# Patient Record
Sex: Female | Born: 1995 | Race: White | Hispanic: No | Marital: Single | Smoking: Never smoker
Health system: Southern US, Community
[De-identification: ages and names within clinical notes are randomized; demographics above are authoritative.]

## PROBLEM LIST (undated history)

## (undated) DIAGNOSIS — F410 Panic disorder [episodic paroxysmal anxiety] without agoraphobia: Secondary | ICD-10-CM

## (undated) DIAGNOSIS — K5909 Other constipation: Secondary | ICD-10-CM

## (undated) DIAGNOSIS — S83249A Other tear of medial meniscus, current injury, unspecified knee, initial encounter: Secondary | ICD-10-CM

## (undated) DIAGNOSIS — N920 Excessive and frequent menstruation with regular cycle: Secondary | ICD-10-CM

## (undated) DIAGNOSIS — F988 Other specified behavioral and emotional disorders with onset usually occurring in childhood and adolescence: Secondary | ICD-10-CM

## (undated) DIAGNOSIS — F411 Generalized anxiety disorder: Secondary | ICD-10-CM

## (undated) HISTORY — DX: Other constipation: K59.09

## (undated) HISTORY — DX: Other specified behavioral and emotional disorders with onset usually occurring in childhood and adolescence: F98.8

## (undated) HISTORY — DX: Generalized anxiety disorder: F41.1

## (undated) HISTORY — DX: Other tear of medial meniscus, current injury, unspecified knee, initial encounter: S83.249A

## (undated) HISTORY — DX: Panic disorder (episodic paroxysmal anxiety): F41.0

## (undated) HISTORY — DX: Excessive and frequent menstruation with regular cycle: N92.0

---
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
## 2018-02-14 ENCOUNTER — Emergency Department (HOSPITAL_COMMUNITY)
Admission: EM | Admit: 2018-02-14 | Discharge: 2018-02-14 | Disposition: A | Discharge status: Home / Self Care | Payer: BLUE CROSS/BLUE SHIELD | Attending: Emergency Medicine | Admitting: Emergency Medicine

## 2018-02-14 ENCOUNTER — Other Ambulatory Visit: Payer: Self-pay

## 2018-02-14 ENCOUNTER — Encounter (HOSPITAL_COMMUNITY): Payer: Self-pay | Admitting: Emergency Medicine

## 2018-02-14 DIAGNOSIS — J329 Chronic sinusitis, unspecified: Secondary | ICD-10-CM | POA: Diagnosis not present

## 2018-02-14 DIAGNOSIS — R05 Cough: Secondary | ICD-10-CM | POA: Diagnosis not present

## 2018-02-14 DIAGNOSIS — F909 Attention-deficit hyperactivity disorder, unspecified type: Secondary | ICD-10-CM | POA: Diagnosis not present

## 2018-02-14 DIAGNOSIS — J328 Other chronic sinusitis: Secondary | ICD-10-CM | POA: Diagnosis not present

## 2018-02-14 DIAGNOSIS — Z79899 Other long term (current) drug therapy: Secondary | ICD-10-CM | POA: Insufficient documentation

## 2018-02-14 DIAGNOSIS — R0981 Nasal congestion: Secondary | ICD-10-CM | POA: Diagnosis not present

## 2018-02-14 MED ORDER — LORATADINE-PSEUDOEPHEDRINE ER 5-120 MG PO TB12: 1.0000 | ORAL_TABLET | Freq: Two times a day (BID) | ORAL | 0 refills | Status: DC

## 2018-02-14 MED ORDER — PREDNISONE 20 MG PO TABS
40.0000 mg | ORAL_TABLET | Freq: Once | ORAL | Status: AC
  Administered 2018-02-14: 40 mg via ORAL
  Filled 2018-02-14: qty 2

## 2018-02-14 MED ORDER — ONDANSETRON HCL 4 MG PO TABS
4.0000 mg | ORAL_TABLET | Freq: Once | ORAL | Status: AC
  Administered 2018-02-14: 4 mg via ORAL
  Filled 2018-02-14: qty 1

## 2018-02-14 MED ORDER — IBUPROFEN 400 MG PO TABS: 400.0000 mg | ORAL_TABLET | Freq: Four times a day (QID) | ORAL | 0 refills | Status: DC | PRN

## 2018-02-14 MED ORDER — IBUPROFEN 400 MG PO TABS
400.0000 mg | ORAL_TABLET | Freq: Once | ORAL | Status: AC
  Administered 2018-02-14: 400 mg via ORAL
  Filled 2018-02-14: qty 1

## 2018-02-14 MED ORDER — PSEUDOEPHEDRINE HCL 60 MG PO TABS
60.0000 mg | ORAL_TABLET | Freq: Once | ORAL | Status: AC
  Administered 2018-02-14: 60 mg via ORAL
  Filled 2018-02-14: qty 1

## 2018-02-14 MED ORDER — DEXAMETHASONE 4 MG PO TABS: 4.0000 mg | ORAL_TABLET | Freq: Two times a day (BID) | ORAL | 0 refills | Status: DC

## 2018-02-14 NOTE — ED Provider Notes (Signed)
[facility] EMERGENCY DEPARTMENT Provider Note   CSN: [id number] Arrival date & time: 02/14/18  4098     History   Chief Complaint Chief Complaint  Patient presents with  . Cough    HPI Patricia Meyers is a 22 y.o. female.  Patient is a 22 year old female who presents to the emergency department with a complaint of facial pain and sinus tightness.  The patient states that she has been sick off and on for most of this month.  She has had problems approximately 1-2 weeks ago with sinus congestion.  She says the mucus went from clear, 2 white to green.  She has been having problems with pressure in her sinus areas.  She does not recall any high fever.  She has some scratchiness of her throat.  She has pain when she blows her nose.  She has left-sided more than other areas headache.  She has not had any operations or procedures involving her ears, or sinuses.  She is able to eat and swallow without problem.  She has some cough but mostly nonproductive.  She presents now for assistance with this issue.      Past Medical History:  Diagnosis Date  . Acne   . ADD (attention deficit disorder) 05/11/2013  . Encounter for menstrual regulation 01/26/2015  . Menorrhagia 01/26/2015    Patient Active Problem List   Diagnosis Date Noted  . Menorrhagia 01/26/2015  . Encounter for menstrual regulation 01/26/2015  . ADD (attention deficit disorder) 05/11/2013    History reviewed. No pertinent surgical history.  OB History    Gravida Para Term Preterm AB Living   0 0 0 0 0 0   SAB TAB Ectopic Multiple Live Births   0 0 0 0         Home Medications    Prior to Admission medications   Medication Sig Start Date End Date Taking? Authorizing Provider  DIFFERIN 0.1 % gel at bedtime.  12/28/14   [provider]  Norethin Ace-Eth Estrad-FE (MINASTRIN 24 FE) 1-20 MG-MCG(24) CHEW Take 1 daily 01/26/15   Adline Potter, NP    Family History Family History  Problem Relation Age  of Onset  . Cancer Maternal Grandmother   . Cancer Maternal Grandfather   . Heart disease Maternal Grandfather   . Diabetes Paternal Grandmother     Social History Social History   Tobacco Use  . Smoking status: Never Smoker  . Smokeless tobacco: Never Used  Substance Use Topics  . Alcohol use: No  . Drug use: No     Allergies   Patient has no known allergies.   Review of Systems Review of Systems  Constitutional: Negative for activity change.       All ROS Neg except as noted in HPI  HENT: Positive for congestion, ear pain and sinus pressure. Negative for nosebleeds.   Eyes: Negative for photophobia and discharge.  Respiratory: Negative for cough, shortness of breath and wheezing.   Cardiovascular: Negative for chest pain and palpitations.  Gastrointestinal: Negative for abdominal pain and blood in stool.  Genitourinary: Negative for dysuria, frequency and hematuria.  Musculoskeletal: Negative for arthralgias, back pain and neck pain.  Skin: Negative.   Neurological: Positive for headaches. Negative for dizziness, seizures and speech difficulty.  Psychiatric/Behavioral: Negative for confusion and hallucinations.     Physical Exam Updated Vital Signs BP 126/81 (BP Location: Right Arm)   Pulse 93   Temp 98.1 F (36.7 C) (Oral)  Resp 18   Ht 5\' 8"  (1.727 m)   Wt 52.2 kg (115 lb)   LMP 02/12/2018   SpO2 100%   BMI 17.49 kg/m   Physical Exam  Constitutional: She is oriented to person, place, and time. She appears well-developed and well-nourished.  Non-toxic appearance.  HENT:  Head: Normocephalic.  Right Ear: Tympanic membrane and external ear normal.  Left Ear: Tympanic membrane and external ear normal.  There is mild to moderate pain to percussion over the sinus areas.  Left greater than right.  The tympanic membranes are within normal limits bilaterally.  There is no mastoid involvement.  There is minimal increased redness of the posterior pharynx.  The  uvula is in the midline.  The airway is patent.  Eyes: EOM and lids are normal. Pupils are equal, round, and reactive to light.  Neck: Normal range of motion. Neck supple. Carotid bruit is not present.  Cardiovascular: Normal rate, regular rhythm, normal heart sounds, intact distal pulses and normal pulses.  Pulmonary/Chest: Breath sounds normal. No respiratory distress.  There is symmetrical rise and fall of the chest.  Patient is ambulatory without respiratory problem.  Abdominal: Soft. Bowel sounds are normal. There is no tenderness. There is no guarding.  Musculoskeletal: Normal range of motion.  Lymphadenopathy:       Head (right side): No submandibular adenopathy present.       Head (left side): No submandibular adenopathy present.    She has no cervical adenopathy.  Neurological: She is alert and oriented to person, place, and time. She has normal strength. No cranial nerve deficit or sensory deficit.  Skin: Skin is warm and dry.  Psychiatric: She has a normal mood and affect. Her speech is normal.  Nursing note and vitals reviewed.    ED Treatments / Results  Labs (all labs ordered are listed, but only abnormal results are displayed) Labs Reviewed - No data to display  EKG  EKG Interpretation None       Radiology No results found.  Procedures Procedures (including critical care time)  Medications Ordered in ED Medications - No data to display   Initial Impression / Assessment and Plan / ED Course  I have reviewed the triage vital signs and the nursing notes.  Pertinent labs & imaging results that were available during my care of the patient were reviewed by me and considered in my medical decision making (see chart for details).      Final Clinical Impressions(s) / ED Diagnoses MDM  Vital signs are within normal limits.  There is mild pain to percussion of the left greater than right sinus areas.  No hot areas appreciated.  I suspect the patient has a  sinusitis, and possible upper respiratory infection as well.  The patient is asked to use Claritin-D and Decadron every 12 hours or 2 times daily.  The patient will use ibuprofen 4 times daily.  I have asked patient to increase fluids.  She will consult ear nose and throat if not improving in the next for 5 days since this is her second bout of sinus related issue within this month.   Final diagnoses:  Other sinusitis, unspecified chronicity    ED Discharge Orders        Ordered    loratadine-pseudoephedrine (CLARITIN-D 12 HOUR) 5-120 MG tablet  2 times daily     02/14/18 1204    dexamethasone (DECADRON) 4 MG tablet  2 times daily with meals     02/14/18 1204  ibuprofen (ADVIL,MOTRIN) 400 MG tablet  Every 6 hours PRN     02/14/18 1204       Ivery QualeBryant, Welford Christmas, PA-C 02/14/18 1213    Donnetta Hutchingook, Brian, MD 02/14/18 [phone]

## 2018-02-14 NOTE — ED Triage Notes (Signed)
Pt reports facial/head pressure, cough with intermittent production last several days. Pt denies v/d/fever.

## 2018-02-14 NOTE — ED Notes (Signed)
Seen by physician Mon for complaint of sinus pressure and pain  Now states it is worse with pain to ears when blowing her nose

## 2018-02-14 NOTE — Discharge Instructions (Signed)
Please increase water, juices, Gatorade.  Please use Claritin-D and Decadron 2 times daily or every 12 hours.  Use ibuprofen with breakfast, lunch, dinner, and at bedtime.  If symptoms are not improving over the next for 5 days, please see the ear nose and throat specialists included here on your discharge instructions.

## 2018-02-23 DIAGNOSIS — G43009 Migraine without aura, not intractable, without status migrainosus: Secondary | ICD-10-CM | POA: Diagnosis not present

## 2018-06-18 ENCOUNTER — Ambulatory Visit: Payer: BLUE CROSS/BLUE SHIELD | Admitting: Family Medicine

## 2018-06-18 ENCOUNTER — Encounter: Payer: Self-pay | Admitting: Family Medicine

## 2018-06-18 VITALS — BP 102/67 | HR 78 | Temp 98.4°F | Resp 16 | Ht 67.0 in | Wt 109.5 lb

## 2018-06-18 DIAGNOSIS — K648 Other hemorrhoids: Secondary | ICD-10-CM | POA: Diagnosis not present

## 2018-06-18 NOTE — Progress Notes (Signed)
Office Note 06/18/2018  CC:  Chief Complaint  Patient presents with  . Establish Care    Previous PCP - [facility] - Dr. Lacy Duverney  . Hemorrhoids    HPI:  CINTHYA Meyers is a 22 y.o. White female who is here to establish care. Patient's most recent primary MD: Dr. Lacy Duverney at [facility] in [place]. Old records in EPIC/[facility] EMR were reviewed prior to or during today's visit.  Hx of Menstrual probs: Menarche age 73; menses regular and heavy.  She got on OCPs and then heavy only 2d, then light 5d---more normal. Now has menstrual cycle monthly.  Eats well, no exercise.  She has always been quite thin. No suspicion in the past of any eating disorder.  No probs with depression.  Says she has a hemorrhoid x 3 wks.  Feels it protrude when she has bm, looked with a mirror once--grape like in shape and color (purplish).. It seems internal and retracts w/out problem.  No bleeding.  No external hemorrhoids noted.  Prep H not working. Feels like her ability to easily get BM out lately is impaired. No probs with constipation.  Past Medical History:  Diagnosis Date  . ADD (attention deficit disorder) 05/11/2013   with ODD/conduct disorder features per prior PCP notes.  No longer required meds after age 89.  . Menorrhagia 01/26/2015   resolved as of age 35 or so.    History reviewed. No pertinent surgical history.  Family History  Problem Relation Age of Onset  . Hypertension Mother   . Cancer Maternal Grandmother   . Lung cancer Maternal Grandmother   . Cancer Maternal Grandfather   . Heart disease Maternal Grandfather   . Lung cancer Maternal Grandfather   . Alcohol abuse Maternal Grandfather   . Arthritis Paternal Grandfather   . Hearing loss Paternal Grandfather   . Asthma Paternal Grandfather     Social History   Socioeconomic History  . Marital status: Single    Spouse name: Not on file  . Number of children: Not on file  . Years of education: Not on  file  . Highest education level: Not on file  Occupational History  . Not on file  Social Needs  . Financial resource strain: Not on file  . Food insecurity:    Worry: Not on file    Inability: Not on file  . Transportation needs:    Medical: Not on file    Non-medical: Not on file  Tobacco Use  . Smoking status: Never Smoker  . Smokeless tobacco: Never Used  Substance and Sexual Activity  . Alcohol use: No  . Drug use: No  . Sexual activity: Never  Lifestyle  . Physical activity:    Days per week: Not on file    Minutes per session: Not on file  . Stress: Not on file  Relationships  . Social connections:    Talks on phone: Not on file    Gets together: Not on file    Attends religious service: Not on file    Active member of club or organization: Not on file    Attends meetings of clubs or organizations: Not on file    Relationship status: Not on file  . Intimate partner violence:    Fear of current or ex partner: Not on file    Emotionally abused: Not on file    Physically abused: Not on file    Forced sexual activity: Not on file  Other Topics Concern  . Not on file  Social History Narrative   Single, living with a friend as of 05/2018.   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   No tobacco.   Alcohol: none.   No drugs.   She is homosexual, identifies herself as female.    Outpatient Encounter Medications as of 06/18/2018  Medication Sig  . [DISCONTINUED] dexamethasone (DECADRON) 4 MG tablet Take 1 tablet (4 mg total) by mouth 2 (two) times daily with a meal. (Patient not taking: Reported on 06/18/2018)  . [DISCONTINUED] ibuprofen (ADVIL,MOTRIN) 400 MG tablet Take 1 tablet (400 mg total) by mouth every 6 (six) hours as needed. (Patient not taking: Reported on 06/18/2018)  . [DISCONTINUED] loratadine-pseudoephedrine (CLARITIN-D 12 HOUR) 5-120 MG tablet Take 1 tablet by mouth 2 (two) times daily. (Patient not taking: Reported on 06/18/2018)   No  facility-administered encounter medications on file as of 06/18/2018.     No Known Allergies  ROS Review of Systems  Constitutional: Negative for fatigue and fever.  HENT: Negative for congestion and sore throat.   Eyes: Negative for visual disturbance.  Respiratory: Negative for cough.   Cardiovascular: Negative for chest pain.  Gastrointestinal: Negative for abdominal pain and nausea.  Genitourinary: Negative for dysuria.  Musculoskeletal: Negative for back pain and joint swelling.  Skin: Negative for rash.  Neurological: Negative for weakness and headaches.  Hematological: Negative for adenopathy.    PE; Blood pressure 102/67, pulse 78, temperature 98.4 F (36.9 C), temperature source Oral, resp. rate 16, height 5\' 7"  (1.702 m), weight 109 lb 8 oz (49.7 kg), last menstrual period 05/30/2018, SpO2 100 %. Body mass index is 17.15 kg/m. Pt examined with Pryor OchoaHeather Sutherland, CMA, as chaperone.  Gen: Alert, well appearing.  Patient is oriented to person, place, time, and situation. AFFECT: pleasant, lucid thought and speech. RUE:AVWUENT:Eyes: no injection, icteris, swelling, or exudate.  EOMI, PERRLA. Mouth: lips without lesion/swelling.  Oral mucosa pink and moist. Oropharynx without erythema, exudate, or swelling.  Neck - No masses or thyromegaly or limitation in range of motion CV: RRR, no m/r/g.   LUNGS: CTA bilat, nonlabored resps, good aeration in all lung fields. EXT: no clubbing, cyanosis, or edema.  Anal: no fissure, no hemorrhoid, no mass, no tenderness. With bearing down there is nothing that protrudes from anus.  Pertinent labs:  none  ASSESSMENT AND PLAN:   New pt:  Internal hemorrhoid suspected, prolapsing. We discussed watchful waiting for spontaneous resolution vs referral to GI for possible banding/ligation. She chose to wait another 2 weeks to see if it improves/resolves, and if it doesn't she will call to request GI referral. Signs/symptoms to call or return for  were reviewed and pt expressed understanding.  An After Visit Summary was printed and given to the patient.  Return for as needed..  Signed:  Santiago BumpersPhil Jaiceon Collister, MD           06/18/2018

## 2018-07-13 DIAGNOSIS — N390 Urinary tract infection, site not specified: Secondary | ICD-10-CM | POA: Diagnosis not present

## 2018-07-13 DIAGNOSIS — N959 Unspecified menopausal and perimenopausal disorder: Secondary | ICD-10-CM | POA: Diagnosis not present

## 2018-07-15 ENCOUNTER — Ambulatory Visit: Payer: BLUE CROSS/BLUE SHIELD | Admitting: Family Medicine

## 2018-07-15 NOTE — Progress Notes (Deleted)
OFFICE VISIT  07/15/2018   CC: No chief complaint on file.    HPI:    Patient is a 22 y.o. Caucasian female who presents for anxiety.  Past Medical History:  Diagnosis Date  . ADD (attention deficit disorder) 05/11/2013   with ODD/conduct disorder features per prior PCP notes.  No longer required meds after age 22.  . Menorrhagia 01/26/2015   resolved as of age 22 or so.    No past surgical history on file.  No outpatient medications prior to visit.   No facility-administered medications prior to visit.     No Known Allergies  ROS As per HPI  PE: There were no vitals taken for this visit. ***  LABS:  ***  IMPRESSION AND PLAN:  No problem-specific Assessment & Plan notes found for this encounter.   FOLLOW UP: No follow-ups on file.

## 2018-07-20 ENCOUNTER — Ambulatory Visit: Payer: BLUE CROSS/BLUE SHIELD | Admitting: Family Medicine

## 2018-07-20 ENCOUNTER — Encounter: Payer: Self-pay | Admitting: *Deleted

## 2018-07-20 ENCOUNTER — Encounter: Payer: Self-pay | Admitting: Family Medicine

## 2018-07-20 VITALS — BP 97/67 | HR 74 | Temp 98.2°F | Resp 16 | Ht 67.0 in | Wt 112.5 lb

## 2018-07-20 DIAGNOSIS — R350 Frequency of micturition: Secondary | ICD-10-CM | POA: Diagnosis not present

## 2018-07-20 DIAGNOSIS — R3915 Urgency of urination: Secondary | ICD-10-CM | POA: Diagnosis not present

## 2018-07-20 DIAGNOSIS — R3129 Other microscopic hematuria: Secondary | ICD-10-CM

## 2018-07-20 DIAGNOSIS — R319 Hematuria, unspecified: Secondary | ICD-10-CM

## 2018-07-20 DIAGNOSIS — R102 Pelvic and perineal pain: Secondary | ICD-10-CM

## 2018-07-20 LAB — POC URINALSYSI DIPSTICK (AUTOMATED)
Bilirubin, UA: NEGATIVE
Blood, UA: 25
Glucose, UA: NEGATIVE
Ketones, UA: NEGATIVE
Leukocytes, UA: NEGATIVE
Nitrite, UA: NEGATIVE
Protein, UA: NEGATIVE
Spec Grav, UA: 1.025 (ref 1.010–1.025)
Urobilinogen, UA: 0.2 E.U./dL
pH, UA: 6 (ref 5.0–8.0)

## 2018-07-20 LAB — URINALYSIS, ROUTINE W REFLEX MICROSCOPIC
Bilirubin Urine: NEGATIVE
Ketones, ur: NEGATIVE
Leukocytes, UA: NEGATIVE
Nitrite: NEGATIVE
Specific Gravity, Urine: 1.015 (ref 1.000–1.030)
Total Protein, Urine: NEGATIVE
Urine Glucose: NEGATIVE
Urobilinogen, UA: 0.2 (ref 0.0–1.0)
pH: 6 (ref 5.0–8.0)

## 2018-07-20 NOTE — Progress Notes (Signed)
OFFICE VISIT  07/20/2018   CC:  Chief Complaint  Patient presents with  . Hematuria    [facility] advised to f/u w/ PCP for referral, fx of IC    HPI:   Patient is a 22 y.o.  female who presents for urinary/bladder complaints. C/o "years" of urinary frequency, suprapubic pressure, nocturia, incomplete emptying.  No dysuria. All sx's worsening x 1 yr. [facility] and she had microscopic hematuria. Urine clx NEG.  She has never seen gross hematuria. No hx of culture positive UTIs per pt. LMP approx 06/26/18.  No recent vag bleeding.  No recent vag d/c.  Reports FH of interstitial cystitis--mother.    Past Medical History:  Diagnosis Date  . ADD (attention deficit disorder) 05/11/2013   with ODD/conduct disorder features per prior PCP notes.  No longer required meds after age 89.  . Menorrhagia 01/26/2015   resolved as of age 22 or so.    History reviewed. No pertinent surgical history.  PMH: none PSH: none  No Known Allergies  ROS As per HPI  PE: Blood pressure 97/67, pulse 74, temperature 98.2 F (36.8 C), temperature source Oral, resp. rate 16, height 5\' 7"  (1.702 m), weight 112 lb 8 oz (51 kg), last menstrual period 06/26/2018, SpO2 100 %.  Pt examined with Pryor OchoaHeather Sutherland, CMA, as chaperone.  Gen: Alert, well appearing.  Patient is oriented to person, place, time, and situation. AFFECT: pleasant, lucid thought and speech. Neck: no TM or mass. CV: RRR, no m/r/g.   LUNGS: CTA bilat, nonlabored resps, good aeration in all lung fields. ABD: soft, NT, ND, BS normal.  No hepatospenomegaly or mass.  No bruits. She does have significant feeling of internal pressure and need to urinate when I press in suprapubic region.   LABS:   CC UA today: 1+ blood, otherwise normal.    IMPRESSION AND PLAN:  Chronic lower urinary tract symptoms, microhematuria, NO HX OF BLADDER INFECTIONS ON PAST CULTURES, and FH of IC in mother. Send urine for UA  with reflex microscopy. Refer to Urology: Dr. Retta Dionesahlstedt with [facility] per pt preference.  An After Visit Summary was printed and given to the patient.  FOLLOW UP: Return for f/u as needed.  Signed:  Santiago BumpersPhil Armonie Mettler, MD           07/20/2018

## 2018-07-23 ENCOUNTER — Telehealth: Payer: Self-pay | Admitting: Family Medicine

## 2018-07-23 NOTE — Telephone Encounter (Signed)
Left message to call back regarding lab results.

## 2018-07-23 NOTE — Telephone Encounter (Signed)
Copied from CRM [id number]. Topic: Quick Communication - Lab Results >> Jul 22, 2018 12:01 PM Smitty KnudsenSutherland, Heather K, CMA wrote: See lab result note dated 07/20/18. [facility].  Okay for P[facility] to discuss results/PCP recommendations.   >> Jul 23, 2018  1:38 PM Arlyss Gandyichardson, Marta Bouie N, NT wrote: Pt calling back to receive lab results.

## 2018-07-29 ENCOUNTER — Telehealth: Payer: Self-pay | Admitting: Family Medicine

## 2018-07-29 NOTE — Telephone Encounter (Signed)
Copied from CRM [id number]. Topic: Quick Communication - Lab Results >> Jul 22, 2018 12:01 PM Smitty KnudsenSutherland, Heather K, CMA wrote: See lab result note dated 07/20/18. [facility]  Okay for P[facility] to discuss results/PCP recommendations.   >> Jul 23, 2018  1:38 PM Arlyss Gandyichardson, Taren N, NT wrote: Pt calling back to receive lab results.  Patient called and is inquiring about her results CB# [phone]

## 2018-07-29 NOTE — Telephone Encounter (Signed)
Pt given results per notes of Dr. Milinda CaveMcGowen on 07/20/18, patient verbalized understanding. Unable to document in result note.

## 2018-09-15 ENCOUNTER — Ambulatory Visit: Payer: BLUE CROSS/BLUE SHIELD | Admitting: Urology

## 2018-09-15 DIAGNOSIS — R311 Benign essential microscopic hematuria: Secondary | ICD-10-CM | POA: Diagnosis not present

## 2018-09-15 DIAGNOSIS — R102 Pelvic and perineal pain: Secondary | ICD-10-CM | POA: Diagnosis not present

## 2018-09-15 DIAGNOSIS — R350 Frequency of micturition: Secondary | ICD-10-CM | POA: Diagnosis not present

## 2018-09-15 DIAGNOSIS — R3915 Urgency of urination: Secondary | ICD-10-CM

## 2018-09-16 ENCOUNTER — Encounter: Payer: Self-pay | Admitting: Family Medicine

## 2018-09-21 ENCOUNTER — Encounter: Payer: Self-pay | Admitting: Family Medicine

## 2018-09-21 ENCOUNTER — Ambulatory Visit: Payer: BLUE CROSS/BLUE SHIELD | Admitting: Family Medicine

## 2018-09-21 VITALS — BP 106/73 | HR 79 | Temp 98.8°F | Resp 20 | Ht 67.0 in | Wt 113.2 lb

## 2018-09-21 DIAGNOSIS — L743 Miliaria, unspecified: Secondary | ICD-10-CM | POA: Diagnosis not present

## 2018-09-21 DIAGNOSIS — R21 Rash and other nonspecific skin eruption: Secondary | ICD-10-CM

## 2018-09-21 MED ORDER — FLUTICASONE PROPIONATE 0.05 % EX CREA: TOPICAL_CREAM | Freq: Two times a day (BID) | CUTANEOUS | 1 refills | Status: DC

## 2018-09-21 NOTE — Progress Notes (Signed)
OFFICE VISIT  09/21/2018   CC:  Chief Complaint  Patient presents with  . Rash    x 1 month itchy red     HPI:    Patient is a 22 y.o.  female who presents for itchy rash. Onset of itchy rash L>R armpit about 1 mo ago.  She recalls no change in anything prior (such as detergent, soap, perfume, deoderant, fabric softener.)  NO one around her with similar rash. She tried some antibacterial cream but no help. No f/c/malaise.  Past Medical History:  Diagnosis Date  . ADD (attention deficit disorder) 05/11/2013   with ODD/conduct disorder features per prior PCP notes.  No longer required meds after age 57.  . Menorrhagia 01/26/2015   resolved as of age 21 or so.    History reviewed. No pertinent surgical history.  No outpatient medications prior to visit.   No facility-administered medications prior to visit.     No Known Allergies  ROS As per HPI  PE: Blood pressure 106/73, pulse 79, temperature 98.8 F (37.1 C), resp. rate 20, height 5\' 7"  (1.702 m), weight 113 lb 4 oz (51.4 kg), last menstrual period 08/24/2018, SpO2 98 %. Pt examined with Judie Grieve, nurse, as chaperone. R axillary fold with a splotch of pinkish papular rash.  No vesicles, pustules, erythema, or hives.  LABS:  none  IMPRESSION AND PLAN:  Miliaria (heat rash) vs eczematous dermatitis. Reassured pt -->no infection. Cutivate 0.05% cream bid to affected area rx'd.  An After Visit Summary was printed and given to the patient.  FOLLOW UP: No follow-ups on file.  Signed:  Santiago Bumpers, MD           09/21/2018

## 2018-09-24 ENCOUNTER — Ambulatory Visit: Payer: BLUE CROSS/BLUE SHIELD | Admitting: Family Medicine

## 2018-11-17 ENCOUNTER — Ambulatory Visit: Payer: BLUE CROSS/BLUE SHIELD | Admitting: Urology

## 2019-01-25 ENCOUNTER — Encounter: Payer: Self-pay | Admitting: Family Medicine

## 2019-01-29 ENCOUNTER — Ambulatory Visit: Payer: BLUE CROSS/BLUE SHIELD | Admitting: Family Medicine

## 2019-04-15 ENCOUNTER — Encounter: Payer: Self-pay | Admitting: Family Medicine

## 2019-08-05 ENCOUNTER — Ambulatory Visit: Payer: BLUE CROSS/BLUE SHIELD | Admitting: Family Medicine

## 2019-08-11 ENCOUNTER — Other Ambulatory Visit: Payer: Self-pay

## 2019-08-11 ENCOUNTER — Ambulatory Visit: Payer: BLUE CROSS/BLUE SHIELD | Admitting: Family Medicine

## 2019-08-11 ENCOUNTER — Encounter: Payer: Self-pay | Admitting: Family Medicine

## 2019-08-11 VITALS — BP 115/80 | HR 86 | Temp 97.8°F | Resp 16 | Ht 67.0 in | Wt 113.4 lb

## 2019-08-11 DIAGNOSIS — F411 Generalized anxiety disorder: Secondary | ICD-10-CM | POA: Diagnosis not present

## 2019-08-11 DIAGNOSIS — Z23 Encounter for immunization: Secondary | ICD-10-CM | POA: Diagnosis not present

## 2019-08-11 DIAGNOSIS — F410 Panic disorder [episodic paroxysmal anxiety] without agoraphobia: Secondary | ICD-10-CM

## 2019-08-11 MED ORDER — FLUOXETINE HCL 20 MG PO TABS: 20.0000 mg | ORAL_TABLET | Freq: Every day | ORAL | 1 refills | Status: DC

## 2019-08-11 NOTE — Progress Notes (Signed)
OFFICE VISIT  08/11/2019   CC:  Chief Complaint  Patient presents with  . Anxiety    Pt feels the has had anxiety for years. Pt has had problems with OCD she thinks since childhood. No formal dx    HPI:    Patient is a 23 y.o. Caucasian female who presents for anxiety. Describes periods of intense anxiety.  Has had problems with this all her life, lately in the midst of a bad spell. Has panic attacks that start with SOB, CP, palms sweat, has body numbness, feels like she is going to die. Panic attacks occur in the context of high anxiety AND sometimes occur w/out warning or preceding anxiety. Poor sleep.  Angered easily.  No sadness or depression.  Concentration ok in general. Mind racing with anxious thoughts a lot at night.   No probs with the law, no promiscuous activity, no problems with losing jobs, no drug or alc use. Intrusive thoughts described: "if I wear a certain outfit some specific bad thing is going to happen to me". Question hx of handwashing compulsion, spitting compulsion, cleanliness.    OCD tendencies.    Past Medical History:  Diagnosis Date  . ADD (attention deficit disorder) 05/11/2013   with ODD/conduct disorder features per prior PCP notes.  No longer required meds after age 44.  . Menorrhagia 01/26/2015   resolved as of age 72 or so.    History reviewed. No pertinent surgical history.  Outpatient Medications Prior to Visit  Medication Sig Dispense Refill  . fluticasone (CUTIVATE) 0.05 % cream Apply topically 2 (two) times daily. (Patient not taking: Reported on 08/11/2019) 30 g 1   No facility-administered medications prior to visit.     No Known Allergies  ROS As per HPI  PE: Blood pressure 115/80, pulse 86, temperature 97.8 F (36.6 C), temperature source Temporal, resp. rate 16, height 5\' 7"  (1.702 m), weight 113 lb 6 oz (51.4 kg), SpO2 99 %. Body mass index is 17.76 kg/m.  Wt Readings from Last 2 Encounters:  08/11/19 113 lb 6 oz (51.4  kg)  09/21/18 113 lb 4 oz (51.4 kg)    Gen: alert, oriented x 4, affect pleasant.  Lucid thinking and conversation noted. HEENT: PERRLA, EOMI.   Neck: no LAD, mass, or thyromegaly. CV: RRR, no m/r/g LUNGS: CTA bilat, nonlabored. NEURO: no tremor or tics noted on observation.  Coordination intact. CN 2-12 grossly intact bilaterally, strength 5/5 in all extremeties.  No ataxia.   LABS:  none  IMPRESSION AND PLAN:  GAD with panic disorder. Lower suspicion of bipolar d/o.  She has ocd tendencies but I don't feel that she has full blown ocd. Start fluoxetine 20mg  qd.  Therapeutic expectations and side effect profile of medication discussed today.  Patient's questions answered. No labs today but if poor response to med will likely check TSH and BMET.  An After Visit Summary was printed and given to the patient.  FOLLOW UP: Return in about 4 weeks (around 09/08/2019) for f/u anx/panic.  Signed:  Crissie Sickles, MD           08/11/2019

## 2019-08-11 NOTE — Addendum Note (Signed)
Addended by: Caroll Rancher L on: 08/11/2019 11:51 AM   Modules accepted: Orders

## 2019-09-09 ENCOUNTER — Ambulatory Visit: Payer: BC Managed Care – PPO | Admitting: Family Medicine

## 2019-09-09 ENCOUNTER — Encounter: Payer: Self-pay | Admitting: Family Medicine

## 2019-09-09 NOTE — Progress Notes (Unsigned)
OFFICE VISIT  09/09/2019   CC: No chief complaint on file.    HPI:    Patient is a 23 y.o. Caucasian female who presents for 1 mo f/u GAD with panic disorder. A/P as of last visit 08/11/19: "GAD with panic disorder. Lower suspicion of bipolar d/o.  She has ocd tendencies but I don't feel that she has full blown ocd. Start fluoxetine 20mg  qd.  Therapeutic expectations and side effect profile of medication discussed today.  Patient's questions answered. No labs today but if poor response to med will likely check TSH and BMET".  Interim hx:  ***  Past Medical History:  Diagnosis Date  . ADD (attention deficit disorder) 05/11/2013   with ODD/conduct disorder features per prior PCP notes.  No longer required meds after age 67.  Marland Kitchen GAD (generalized anxiety disorder)   . Menorrhagia 01/26/2015   resolved as of age 89 or so.  . Panic disorder     No past surgical history on file.  Outpatient Medications Prior to Visit  Medication Sig Dispense Refill  . FLUoxetine (PROZAC) 20 MG tablet Take 1 tablet (20 mg total) by mouth daily. 30 tablet 1  . fluticasone (CUTIVATE) 0.05 % cream Apply topically 2 (two) times daily. (Patient not taking: Reported on 08/11/2019) 30 g 1   No facility-administered medications prior to visit.     No Known Allergies  ROS As per HPI  PE: There were no vitals taken for this visit. ***  LABS:  ***  IMPRESSION AND PLAN:  No problem-specific Assessment & Plan notes found for this encounter.   An After Visit Summary was printed and given to the patient.  FOLLOW UP: No follow-ups on file.

## 2019-10-04 ENCOUNTER — Encounter: Payer: Self-pay | Admitting: Family Medicine

## 2019-10-04 ENCOUNTER — Ambulatory Visit: Payer: BC Managed Care – PPO | Admitting: Family Medicine

## 2019-10-04 ENCOUNTER — Other Ambulatory Visit: Payer: Self-pay

## 2019-10-04 VITALS — BP 97/67 | HR 89 | Temp 98.6°F | Resp 16 | Ht 67.0 in | Wt 110.0 lb

## 2019-10-04 DIAGNOSIS — F411 Generalized anxiety disorder: Secondary | ICD-10-CM | POA: Diagnosis not present

## 2019-10-04 DIAGNOSIS — F410 Panic disorder [episodic paroxysmal anxiety] without agoraphobia: Secondary | ICD-10-CM | POA: Diagnosis not present

## 2019-10-04 MED ORDER — FLUOXETINE HCL 20 MG PO TABS: 20.0000 mg | ORAL_TABLET | Freq: Every day | ORAL | 1 refills | Status: DC

## 2019-10-04 NOTE — Progress Notes (Signed)
OFFICE VISIT  10/04/2019   CC:  Chief Complaint  Patient presents with  . Anxiety    patient is doing well. needs refill.   HPI:    Patient is a 23 y.o. Caucasian female who presents for f/u 2 mo f/u GAD with panic disorder. A/p as of last visit: GAD with panic disorder. Lower suspicion of bipolar d/o.  She has ocd tendencies but I don't feel that she has full blown ocd. Start fluoxetine 20mg  qd.  Therapeutic expectations and side effect profile of medication discussed today.  Patient's questions answered. No labs today but if poor response to med will likely check TSH and BMET".  Interim hx: Doing better. No further panic attacks. No side effects. GAD sx's improved some.  Denies depression. Sleep is better.  Appetite fine. Working 3rd shift at [employer].     No new questions today.  ROS: no CP, no SOB, no wheezing, no cough, no dizziness, no HAs, no rashes, no melena/hematochezia.  No polyuria or polydipsia.  No myalgias or arthralgias.   Past Medical History:  Diagnosis Date  . ADD (attention deficit disorder) 05/11/2013   with ODD/conduct disorder features per prior PCP notes.  No longer required meds after age 44.  Marland Kitchen GAD (generalized anxiety disorder)   . Menorrhagia 01/26/2015   resolved as of age 65 or so.  . Panic disorder     History reviewed. No pertinent surgical history.  Outpatient Medications Prior to Visit  Medication Sig Dispense Refill  . FLUoxetine (PROZAC) 20 MG tablet Take 1 tablet (20 mg total) by mouth daily. 30 tablet 1  . fluticasone (CUTIVATE) 0.05 % cream Apply topically 2 (two) times daily. (Patient not taking: Reported on 08/11/2019) 30 g 1   No facility-administered medications prior to visit.     No Known Allergies  ROS As per HPI  PE: Blood pressure 97/67, pulse 89, temperature 98.6 F (37 C), temperature source Temporal, resp. rate 16, height 5\' 7"  (1.702 m), weight 110 lb (49.9 kg), last menstrual period 09/04/2019, SpO2 99  %. Gen: Alert, well appearing.  Patient is oriented to person, place, time, and situation. AFFECT: pleasant, lucid thought and speech. No further exam today.  LABS:  none  IMPRESSION AND PLAN:  GAD with panic disorder:  Very good response to fluoxetine 20mg . We'll keep with current dosing. Signs/symptoms to call or return for were reviewed and pt expressed understanding. F/u 3 mo to see if she has maintained good response/assess need for dose increase.  An After Visit Summary was printed and given to the patient.  FOLLOW UP: Return in about 3 months (around 01/04/2020) for routine chronic illness f/u.  Signed:  Crissie Sickles, MD           10/04/2019

## 2019-10-22 ENCOUNTER — Other Ambulatory Visit: Payer: Self-pay

## 2019-10-22 DIAGNOSIS — Z20822 Contact with and (suspected) exposure to covid-19: Secondary | ICD-10-CM

## 2019-10-24 LAB — NOVEL CORONAVIRUS, NAA: SARS-CoV-2, NAA: DETECTED — AB

## 2019-10-25 ENCOUNTER — Telehealth: Payer: Self-pay | Admitting: Family Medicine

## 2019-10-25 ENCOUNTER — Encounter: Payer: Self-pay | Admitting: Family Medicine

## 2019-10-25 NOTE — Telephone Encounter (Signed)
Letter placed in bin to go up front. Will be scanned to pt's email.

## 2019-10-25 NOTE — Telephone Encounter (Signed)
Letter printed.

## 2019-10-25 NOTE — Telephone Encounter (Signed)
Patient reports test reults are back,.. She tested positive for COVID.  She needs work excuse to be out of work.  Please email work note because positive COVID To  [email or web address]  Thank  you

## 2019-10-25 NOTE — Telephone Encounter (Signed)
Patient had COVID test done on 10/22/19.   Please advise on work note, thanks.

## 2019-10-25 NOTE — Telephone Encounter (Signed)
I need specifics: when did she start getting covid symptoms and what is the first day she missed work? I will only be able to estimate a return to work date so pls let her know this (it will be approx 10d from now as long as things resolve as expected.-thx

## 2020-04-17 ENCOUNTER — Encounter: Payer: Self-pay | Admitting: Family Medicine

## 2020-04-20 ENCOUNTER — Other Ambulatory Visit: Payer: Self-pay

## 2020-04-20 ENCOUNTER — Ambulatory Visit: Payer: BC Managed Care – PPO | Admitting: Family Medicine

## 2020-04-24 ENCOUNTER — Ambulatory Visit: Payer: Self-pay | Admitting: Family Medicine

## 2020-04-24 ENCOUNTER — Encounter: Payer: Self-pay | Admitting: Family Medicine

## 2020-04-24 ENCOUNTER — Other Ambulatory Visit: Payer: Self-pay

## 2020-04-24 ENCOUNTER — Ambulatory Visit (INDEPENDENT_AMBULATORY_CARE_PROVIDER_SITE_OTHER): Payer: BC Managed Care – PPO | Admitting: Family Medicine

## 2020-04-24 VITALS — BP 108/70 | HR 84 | Temp 98.0°F | Resp 16 | Ht 67.0 in | Wt 116.0 lb

## 2020-04-24 DIAGNOSIS — F410 Panic disorder [episodic paroxysmal anxiety] without agoraphobia: Secondary | ICD-10-CM

## 2020-04-24 DIAGNOSIS — T50905A Adverse effect of unspecified drugs, medicaments and biological substances, initial encounter: Secondary | ICD-10-CM | POA: Diagnosis not present

## 2020-04-24 DIAGNOSIS — F411 Generalized anxiety disorder: Secondary | ICD-10-CM

## 2020-04-24 MED ORDER — FLUOXETINE HCL 20 MG PO TABS: 20.0000 mg | ORAL_TABLET | Freq: Every day | ORAL | 3 refills | Status: DC

## 2020-04-24 MED ORDER — BUPROPION HCL ER (XL) 150 MG PO TB24: 150.0000 mg | ORAL_TABLET | Freq: Every day | ORAL | 1 refills | Status: DC

## 2020-04-24 NOTE — Progress Notes (Signed)
See med student note above. Signed:  Phil Ameah Chanda, MD           04/24/2020  

## 2020-04-24 NOTE — Progress Notes (Signed)
CC:  Patricia Meyers is a 24 y.o. Caucasian female with a PMH of GAD w panic who presents for 6 mo f/u GAD with panic.   HPI:  Feels like mood is med is not affecting mood much, says still somewhat "moody" but no prolonged periods of frank depressed/sad mood.   Suicidal ideation?  No  Anxiety? Will still get worried during the day every once in a while, interested in maybe upping dose  No panic attacks since starting fluox. Appetite? No changes  Sex drive? None really ---lost sex drive completely when she started fluoxetine. Thirsty all the time? No  No N/V/C/D  No fatigue, SOB, palpitations  Weird dreams sometimes  Not taking Sominex anymore. No alc or drug use.   PMH: Past Medical History:  Diagnosis Date  . ADD (attention deficit disorder) 05/11/2013   with ODD/conduct disorder features per prior PCP notes.  No longer required meds after age 77.  Marland Kitchen GAD (generalized anxiety disorder)   . Menorrhagia 01/26/2015   resolved as of age 81 or so.  . Panic disorder     M/A: Current Outpatient Medications on File Prior to Visit  Medication Sig Dispense Refill  . diphenhydrAMINE (SOMINEX) 25 MG tablet Take by mouth.    Marland Kitchen FLUoxetine (PROZAC) 20 MG tablet Take 1 tablet (20 mg total) by mouth daily. 90 tablet 1   No current facility-administered medications on file prior to visit.   No Known Allergies  FH: Family History  Problem Relation Age of Onset  . Hypertension Mother   . Cancer Maternal Grandmother   . Lung cancer Maternal Grandmother   . Cancer Maternal Grandfather   . Heart disease Maternal Grandfather   . Lung cancer Maternal Grandfather   . Alcohol abuse Maternal Grandfather   . Arthritis Paternal Grandfather   . Hearing loss Paternal Grandfather   . Asthma Paternal Grandfather     SH: Social History   Socioeconomic History  . Marital status: Single    Spouse name: Not on file  . Number of children: Not on file  . Years of education: Not on file  .  Highest education level: Not on file  Occupational History  . Not on file  Tobacco Use  . Smoking status: Never Smoker  . Smokeless tobacco: Never Used  Substance and Sexual Activity  . Alcohol use: No  . Drug use: No  . Sexual activity: Never  Other Topics Concern  . Not on file  Social History Narrative   Single, living with a friend as of 05/2018.   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   No tobacco.   Alcohol: none.   No drugs.   She is homosexual, identifies herself as female.   Social Determinants of Health   Financial Resource Strain:   . Difficulty of Paying Living Expenses:   Food Insecurity:   . Worried About Programme researcher, broadcasting/film/video in the Last Year:   . Barista in the Last Year:   Transportation Needs:   . Freight forwarder (Medical):   Marland Kitchen Lack of Transportation (Non-Medical):   Physical Activity:   . Days of Exercise per Week:   . Minutes of Exercise per Session:   Stress:   . Feeling of Stress :   Social Connections:   . Frequency of Communication with Friends and Family:   . Frequency of Social Gatherings with Friends and Family:   . Attends Religious Services:   . Active Member  of Clubs or Organizations:   . Attends Archivist Meetings:   Marland Kitchen Marital Status:     ROS: Review of Systems  Constitutional: Negative.  Negative for chills, fever and weight loss.  HENT: Negative.   Eyes: Negative.   Respiratory: Negative.  Negative for cough, sputum production, shortness of breath and wheezing.   Cardiovascular: Negative.  Negative for chest pain, palpitations and leg swelling.  Gastrointestinal: Negative.  Negative for abdominal pain, constipation, diarrhea, heartburn, nausea and vomiting.  Genitourinary: Negative.  Negative for dysuria, frequency and urgency.  Musculoskeletal: Negative.   Skin: Negative.  Negative for itching and rash.  Neurological: Negative.   Endo/Heme/Allergies: Negative.   Psychiatric/Behavioral:  Negative.  Negative for depression, hallucinations, memory loss, substance abuse and suicidal ideas. The patient is not nervous/anxious and does not have insomnia.     PE: Vitals with BMI 04/24/2020 10/04/2019 08/11/2019  Height 5\' 7"  5\' 7"  5\' 7"   Weight 116 lbs 110 lbs 113 lbs 6 oz  BMI 18.16 59.56 38.75  Systolic 643 97 329  Diastolic 70 67 80  Pulse 84 89 86    Physical Exam  Constitutional: She is oriented to person, place, and time and well-developed, well-nourished, and in no distress. No distress.  HENT:  Head: Normocephalic and atraumatic.  Right Ear: External ear normal.  Left Ear: External ear normal.  Eyes: Pupils are equal, round, and reactive to light. Right eye exhibits no discharge. Left eye exhibits no discharge. No scleral icterus.  Neck: No JVD present. No tracheal deviation present. No thyromegaly present.  Cardiovascular: Normal rate, regular rhythm, normal heart sounds and intact distal pulses. Exam reveals no gallop and no friction rub.  No murmur heard. Pulmonary/Chest: Effort normal and breath sounds normal. No stridor. No respiratory distress. She has no wheezes. She has no rales. She exhibits no tenderness.  Musculoskeletal:        General: No edema.     Cervical back: Normal range of motion and neck supple.  Lymphadenopathy:    She has no cervical adenopathy.  Neurological: She is alert and oriented to person, place, and time.  Skin: Skin is warm and dry. No rash noted. She is not diaphoretic. No erythema. No pallor.  Psychiatric: Mood, memory, affect and judgment normal.    Labs: No results found for this or any previous visit (from the past 2160 hour(s)).   A/P: In summary, Patricia Meyers is a 24 year old woman with a past medical history of GAD w panic who presents for 6 month follow up on anxiety. On physical exam, she is unremarkable.   My plan is  GAD, panic attacks.  She is improved but room for improvement, particularly regarding mild mood sx's. Has  impaired libido due to fluoxetine.  Discussed options of switch to diff SSRI vs add wellbutrin to fluox. Opted for continuing fluoxetine 20 mg qd, add bupropion/wellbutrin 150mg  po qd Vaccines: Tdap UTD. Will get COVID shot soon thru work    Follow Up:  4 weeks    Signed: Bennie Dallas, MS3 24 Apr 2020  I personally was present during the history, physical exam, and medical decision-making activities of this service and have verified that the service and findings are accurately documented in the student's note. Signed:  Crissie Sickles, MD           04/24/2020

## 2020-05-31 ENCOUNTER — Telehealth: Payer: BC Managed Care – PPO | Admitting: Family Medicine

## 2020-05-31 NOTE — Progress Notes (Deleted)
Virtual Visit via Video Note  I connected with Patricia Meyers on 05/31/20 at  8:30 AM EDT by a video enabled telemedicine application and verified that I am speaking with the correct person using two identifiers.  Location patient: home Location provider:work or home office Persons participating in the virtual visit: patient, provider  I discussed the limitations of evaluation and management by telemedicine and the availability of in person appointments. The patient expressed understanding and agreed to proceed.  Telemedicine visit is a necessity given the COVID-19 restrictions in place at the current time.  HPI: 24 y/o WF being seen today for 1 mo f/u GAD with panic and some hx of subclinical depression. She had been on fluoxetine, was mildly improved but was having signif sexual dysfunction side effect. We chose to add wellbutrin xl 150mg  to her fluoxetine 20 mg qd.  INTERIM HX: ***   ROS: See pertinent positives and negatives per HPI.  Past Medical History:  Diagnosis Date  . ADD (attention deficit disorder) 05/11/2013   with ODD/conduct disorder features per prior PCP notes.  No longer required meds after age 33.  20 GAD (generalized anxiety disorder)   . Menorrhagia 01/26/2015   resolved as of age 50 or so.  . Panic disorder     No past surgical history on file.  Family History  Problem Relation Age of Onset  . Hypertension Mother   . Cancer Maternal Grandmother   . Lung cancer Maternal Grandmother   . Cancer Maternal Grandfather   . Heart disease Maternal Grandfather   . Lung cancer Maternal Grandfather   . Alcohol abuse Maternal Grandfather   . Arthritis Paternal Grandfather   . Hearing loss Paternal Grandfather   . Asthma Paternal Grandfather    Social History   Socioeconomic History  . Marital status: Single    Spouse name: Not on file  . Number of children: Not on file  . Years of education: Not on file  . Highest education level: Not on file  Occupational History   . Not on file  Tobacco Use  . Smoking status: Never Smoker  . Smokeless tobacco: Never Used  Substance and Sexual Activity  . Alcohol use: No  . Drug use: No  . Sexual activity: Yes  Other Topics Concern  . Not on file  Social History Narrative   Single, living with a friend as of 05/2018.   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   No tobacco.   Alcohol: none.   No drugs.   She is homosexual, identifies herself as female.   Social Determinants of Health   Financial Resource Strain:   . Difficulty of Paying Living Expenses:   Food Insecurity:   . Worried About [employer] in the Last Year:   . Programme researcher, broadcasting/film/video in the Last Year:   Transportation Needs:   . Barista (Medical):   Freight forwarder Lack of Transportation (Non-Medical):   Physical Activity:   . Days of Exercise per Week:   . Minutes of Exercise per Session:   Stress:   . Feeling of Stress :   Social Connections:   . Frequency of Communication with Friends and Family:   . Frequency of Social Gatherings with Friends and Family:   . Attends Religious Services:   . Active Member of Clubs or Organizations:   . Attends Marland Kitchen Meetings:   Banker Marital Status:       Current Outpatient Medications:  .  buPROPion (WELLBUTRIN XL) 150 MG 24 hr tablet, Take 1 tablet (150 mg total) by mouth daily., Disp: 30 tablet, Rfl: 1 .  FLUoxetine (PROZAC) 20 MG tablet, Take 1 tablet (20 mg total) by mouth daily., Disp: 90 tablet, Rfl: 3  EXAM:  VITALS per patient if applicable: There were no vitals taken for this visit.   GENERAL: alert, oriented, appears well and in no acute distress  HEENT: atraumatic, conjunttiva clear, no obvious abnormalities on inspection of external nose and ears  NECK: normal movements of the head and neck  LUNGS: on inspection no signs of respiratory distress, breathing rate appears normal, no obvious gross SOB, gasping or wheezing  CV: no obvious  cyanosis  MS: moves all visible extremities without noticeable abnormality  PSYCH/NEURO: pleasant and cooperative, no obvious depression or anxiety, speech and thought processing grossly intact  Labs: none today  ASSESSMENT AND PLAN:  Discussed the following assessment and plan:  No diagnosis found.  -we discussed possible serious and likely etiologies, options for evaluation and workup, limitations of telemedicine visit vs in person visit, treatment, treatment risks and precautions. Patricia Meyers prefers to treat via telemedicine empirically rather then risking or undertaking an in person visit at this moment. Patient agrees to seek prompt in person care if worsening, new symptoms arise, or if is not improving with treatment.   I discussed the assessment and treatment plan with the patient. The patient was provided an opportunity to ask questions and all were answered. The patient agreed with the plan and demonstrated an understanding of the instructions.   The patient was advised to call back or seek an in-person evaluation if the symptoms worsen or if the condition fails to improve as anticipated.  F/u: ***  Signed:  Crissie Sickles, MD           05/31/2020

## 2020-06-30 ENCOUNTER — Ambulatory Visit
Admission: EM | Admit: 2020-06-30 | Discharge: 2020-06-30 | Disposition: A | Discharge status: Home / Self Care | Payer: BC Managed Care – PPO | Attending: Emergency Medicine | Admitting: Emergency Medicine

## 2020-06-30 ENCOUNTER — Encounter: Payer: Self-pay | Admitting: Emergency Medicine

## 2020-06-30 DIAGNOSIS — M25561 Pain in right knee: Secondary | ICD-10-CM

## 2020-06-30 MED ORDER — NAPROXEN 500 MG PO TABS: 500.0000 mg | ORAL_TABLET | Freq: Two times a day (BID) | ORAL | 0 refills | Status: DC

## 2020-06-30 NOTE — Discharge Instructions (Signed)
Continue conservative management of rest, ice, and elevation Knee brace given in office Take naproxen as needed for pain relief (may cause abdominal discomfort, ulcers, and GI bleeds avoid taking with other NSAIDs) Follow up with PCP for recheck next week Return or go to the ER if you have any new or worsening symptoms (fever, chills, chest pain, redness, swelling, deformity bruising, etc...)

## 2020-06-30 NOTE — ED Triage Notes (Signed)
Pain to RT knee x 2 days. Pt states she moved her leg in the bed a certain way and heard a pop.

## 2020-06-30 NOTE — ED Provider Notes (Signed)
[facility]   [id number] 06/30/20 Arrival Time: 1706  CC:RT knee PAIN  SUBJECTIVE: History from: patient. Patricia Meyers is a 24 y.o. female complains of RT knee pain x 2 days.  Stretching leg in bed and felt pop on inside of knee.  Localizes the pain to the inside of knee.  Describes the pain as intermittent and stiff/ sharp in character.  Has tried OTC medications without relief.  Symptoms are made worse with extending knee.  Denies similar symptoms in the past.  Denies fever, chills, erythema, ecchymosis, effusion, weakness, numbness and tingling.      ROS: As per HPI.  All other pertinent ROS negative.     Past Medical History:  Diagnosis Date  . ADD (attention deficit disorder) 05/11/2013   with ODD/conduct disorder features per prior PCP notes.  No longer required meds after age 60.  Marland Kitchen GAD (generalized anxiety disorder)   . Menorrhagia 01/26/2015   resolved as of age 84 or so.  . Panic disorder    History reviewed. No pertinent surgical history. No Known Allergies No current facility-administered medications on file prior to encounter.   Current Outpatient Medications on File Prior to Encounter  Medication Sig Dispense Refill  . buPROPion (WELLBUTRIN XL) 150 MG 24 hr tablet Take 1 tablet (150 mg total) by mouth daily. 30 tablet 1  . FLUoxetine (PROZAC) 20 MG tablet Take 1 tablet (20 mg total) by mouth daily. 90 tablet 3   Social History   Socioeconomic History  . Marital status: Single    Spouse name: Not on file  . Number of children: Not on file  . Years of education: Not on file  . Highest education level: Not on file  Occupational History  . Not on file  Tobacco Use  . Smoking status: Never Smoker  . Smokeless tobacco: Never Used  Vaping Use  . Vaping Use: Never used  Substance and Sexual Activity  . Alcohol use: No  . Drug use: No  . Sexual activity: Yes  Other Topics Concern  . Not on file  Social History Narrative   Single, living with a  friend as of 05/2018.   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   No tobacco.   Alcohol: none.   No drugs.   She is homosexual, identifies herself as female.   Social Determinants of Health   Financial Resource Strain:   . Difficulty of Paying Living Expenses:   Food Insecurity:   . Worried About Programme researcher, broadcasting/film/video in the Last Year:   . Barista in the Last Year:   Transportation Needs:   . Freight forwarder (Medical):   Marland Kitchen Lack of Transportation (Non-Medical):   Physical Activity:   . Days of Exercise per Week:   . Minutes of Exercise per Session:   Stress:   . Feeling of Stress :   Social Connections:   . Frequency of Communication with Friends and Family:   . Frequency of Social Gatherings with Friends and Family:   . Attends Religious Services:   . Active Member of Clubs or Organizations:   . Attends Banker Meetings:   Marland Kitchen Marital Status:   Intimate Partner Violence:   . Fear of Current or Ex-Partner:   . Emotionally Abused:   Marland Kitchen Physically Abused:   . Sexually Abused:    Family History  Problem Relation Age of Onset  . Hypertension Mother   . Cancer Maternal  Grandmother   . Lung cancer Maternal Grandmother   . Cancer Maternal Grandfather   . Heart disease Maternal Grandfather   . Lung cancer Maternal Grandfather   . Alcohol abuse Maternal Grandfather   . Arthritis Paternal Grandfather   . Hearing loss Paternal Grandfather   . Asthma Paternal Grandfather     OBJECTIVE:  Vitals:   06/30/20 1719 06/30/20 1720  BP:  110/74  Pulse:  92  Resp:  17  Temp:  98.5 F (36.9 C)  TempSrc:  Oral  SpO2:  98%  Weight: 116 lb (52.6 kg)   Height: 5\' 7"  (1.702 m)     General appearance: ALERT; in no acute distress.  Head: NCAT Lungs: Normal respiratory effort CV: Posterior tibialis 2+ Musculoskeletal: RT knee Inspection: Skin warm, dry, clear and intact without obvious erythema, effusion, or ecchymosis. Tattoos  presnt Palpation: mildly TTP over medial joint line ROM: FROM active and passive Strength:  5/5 knee flexion, 5/5 knee extension Stability: Anterior/ posterior drawer intact; + varus stress, - valgus stress Skin: warm and dry Neurologic: Ambulates without difficulty; Sensation intact about the lower extremities Psychological: alert and cooperative; normal mood and affect  ASSESSMENT & PLAN:  1. Acute pain of right knee   2. Medial joint line tenderness of knee, right     Meds ordered this encounter  Medications  . naproxen (NAPROSYN) 500 MG tablet    Sig: Take 1 tablet (500 mg total) by mouth 2 (two) times daily.    Dispense:  30 tablet    Refill:  0    Order Specific Question:   Supervising Provider    Answer:   Eustace Moore    Continue conservative management of rest, ice, and elevation Knee brace given in office Take naproxen as needed for pain relief (may cause abdominal discomfort, ulcers, and GI bleeds avoid taking with other NSAIDs) Follow up with PCP for recheck next week Return or go to the ER if you have any new or worsening symptoms (fever, chills, chest pain, redness, swelling, deformity bruising, etc...)   Reviewed expectations re: course of current medical issues. Questions answered. Outlined signs and symptoms indicating need for more acute intervention. Patient verbalized understanding. After Visit Summary given.    [[id number]], PA-C 06/30/20 1748

## 2020-07-03 ENCOUNTER — Telehealth: Payer: Self-pay

## 2020-07-03 NOTE — Telephone Encounter (Signed)
Patient was seen at urgent care on 7/9 for knee injury 7/8.  Possible torn meniscus.  Patient stated that she was told to follow up with her PCP so that he could order MRI of her knee.  Please advise [phone]

## 2020-07-03 NOTE — Telephone Encounter (Signed)
Patient contacted and f/u appt scheduled. She was made aware if PCP decides to order imaging, it will be done during visit.

## 2020-07-10 ENCOUNTER — Other Ambulatory Visit: Payer: Self-pay

## 2020-07-10 ENCOUNTER — Ambulatory Visit: Payer: BC Managed Care – PPO | Admitting: Family Medicine

## 2020-07-10 ENCOUNTER — Encounter: Payer: Self-pay | Admitting: Family Medicine

## 2020-07-10 VITALS — BP 114/76 | HR 91 | Temp 97.8°F | Resp 16 | Ht 67.0 in | Wt 116.6 lb

## 2020-07-10 DIAGNOSIS — S83241A Other tear of medial meniscus, current injury, right knee, initial encounter: Secondary | ICD-10-CM | POA: Diagnosis not present

## 2020-07-10 DIAGNOSIS — M25561 Pain in right knee: Secondary | ICD-10-CM | POA: Diagnosis not present

## 2020-07-10 NOTE — Progress Notes (Signed)
OFFICE VISIT  07/10/2020   CC:  Chief Complaint  Patient presents with  . Follow-up    ED visit on 7/9, right knee pain   HPI:    Patient is a 24 y.o. Caucasian female who presents for "f/u urgent care visit, possible torn meniscus". [facility] note from 06/30/20 (10 days ago) reviewed today.  HPI: Turned leg in bed caused pap and acute pain.  It buckled on and off.  No preceding overuse or injury. Next day went to UC. Naproxen and knee brace rx'd 06/30/20 by ED provider. A couple days after UC visit it began to swell, hurts more to bear wt and still locking up some. Pain is pretty focally located in medial aspect of R knee. Some burning pain behind knee lately. No LL pain or swelling.  No past hx of R knee problem.  Past Medical History:  Diagnosis Date  . ADD (attention deficit disorder) 05/11/2013   with ODD/conduct disorder features per prior PCP notes.  No longer required meds after age 51.  Marland Kitchen GAD (generalized anxiety disorder)   . Menorrhagia 01/26/2015   resolved as of age 62 or so.  . Panic disorder     No past surgical history on file.  Outpatient Medications Prior to Visit  Medication Sig Dispense Refill  . buPROPion (WELLBUTRIN XL) 150 MG 24 hr tablet Take 1 tablet (150 mg total) by mouth daily. 30 tablet 1  . FLUoxetine (PROZAC) 20 MG capsule Take 20 mg by mouth daily.    . naproxen (NAPROSYN) 500 MG tablet Take 1 tablet (500 mg total) by mouth 2 (two) times daily. 30 tablet 0  . FLUoxetine (PROZAC) 20 MG tablet Take 1 tablet (20 mg total) by mouth daily. 90 tablet 3   No facility-administered medications prior to visit.    No Known Allergies  ROS As per HPI  PE: Blood pressure 114/76, pulse 91, temperature 97.8 F (36.6 C), temperature source Oral, resp. rate 16, height 5\' 7"  (1.702 m), weight 116 lb 9.6 oz (52.9 kg), last menstrual period 06/11/2020, SpO2 99 %. Gen: Alert, well appearing.  Patient is oriented to person, place, time, and situation. AFFECT:  pleasant, lucid thought and speech. R knee with mild swelling/effusion.  Very mild warmth.  No erythema. Extension R leg to 170 before impaired a lot by pain. Flexion to 80 deg before impaired by pain. Medial joint line +focal tenderness. No laxity with varus/valgus stress.  Lachmans and post drawer neg. McMurray's + increase in medial joint line pain, no popping. Popliteal region w/out fullness, tenderness, or nodularity. No LL swelling or edema or tenderness.  LABS:  none  IMPRESSION AND PLAN:  Acute R knee pain.   Question of medial meniscus injury, but mechanism by which she sustained this seems unusual. Recommended she start taking her ibup at 600 mg dose twice daily instead of 400mg  on prn/occasional basis.  Also start icing regimen. Continue R knee brace. Referral to sports medicine discussed, ordered.  An After Visit Summary was printed and given to the patient.  FOLLOW UP: No follow-ups on file.  Signed:  06/13/2020, MD           07/10/2020

## 2020-07-13 ENCOUNTER — Ambulatory Visit: Payer: BC Managed Care – PPO | Admitting: Family Medicine

## 2020-07-13 ENCOUNTER — Encounter: Payer: Self-pay | Admitting: Family Medicine

## 2020-07-13 ENCOUNTER — Other Ambulatory Visit: Payer: Self-pay

## 2020-07-13 ENCOUNTER — Ambulatory Visit: Payer: Self-pay

## 2020-07-13 ENCOUNTER — Ambulatory Visit (INDEPENDENT_AMBULATORY_CARE_PROVIDER_SITE_OTHER): Payer: BC Managed Care – PPO

## 2020-07-13 VITALS — BP 110/78 | HR 86 | Ht 67.0 in | Wt 115.8 lb

## 2020-07-13 DIAGNOSIS — M25561 Pain in right knee: Secondary | ICD-10-CM

## 2020-07-13 NOTE — Progress Notes (Signed)
Subjective:    I'm seeing this patient as a consultation for:  Dr. Arliss Journey. Note will be routed back to referring provider/PCP.  CC: R medial knee pain  I, Patricia Meyers, LAT, ATC, am serving as scribe for Dr. Clementeen Graham.  HPI: Pt is a 25 y/o female presenting w/ R medial knee pain x 2 weeks after moving her r leg in an awkward position in bed.  At that time, she heard a pop and reports that her R knee was locking intermittently for the rest of the night.  She has locking catching swelling and popping.  R knee swelling: yes R knee mechanical symptoms: yes Aggravating factors: squatting; loaded knee rotation; full R knee ext;  Treatments tried: Naproxen; knee brace; ice  Past medical history, Surgical history, Family history, Social history, Allergies, and medications have been entered into the medical record, reviewed.   Review of Systems: No new headache, visual changes, nausea, vomiting, diarrhea, constipation, dizziness, abdominal pain, skin rash, fevers, chills, night sweats, weight loss, swollen lymph nodes, body aches, joint swelling, muscle aches, chest pain, shortness of breath, mood changes, visual or auditory hallucinations.   Objective:    Vitals:   07/13/20 0958  BP: 110/78  Pulse: 86  SpO2: 99%   General: Well Developed, well nourished, and in no acute distress.  Neuro/Psych: Alert and oriented x3, extra-ocular muscles intact, able to move all 4 extremities, sensation grossly intact. Skin: Warm and dry, no rashes noted.  Respiratory: Not using accessory muscles, speaking in full sentences, trachea midline.  Cardiovascular: Pulses palpable, no extremity edema. Abdomen: Does not appear distended. MSK: Right knee mild effusion otherwise normal-appearing Range of motion 5-120 degrees with crepitation. Tender palpation medial joint line. Stable ligamentous exam. Positive medial McMurray's test.  Lab and Radiology Results  X-ray images right knee obtained today  personally and independently reviewed Mild narrowing medial compartment otherwise normal-appearing no acute fractures. Await formal radiology review   Diagnostic Limited MSK Ultrasound of: Right knee Quad tendon intact normal-appearing Small joint effusion superior patellar space. Patellar tendon normal-appearing Lateral joint line normal-appearing Medial joint line fissure visible through medial meniscus without displacement indicating medial meniscus tear. Posterior knee reveals a small Baker's cyst. Impression: Medial meniscus tear joint effusion and small Baker's cyst.   Impression and Recommendations:    Assessment and Plan: 24 y.o. female with physical exam and ultrasound examination concerning for medial meniscus tear.  Her story is a bit odd but is consistent with meniscus tear.  Plan for MRI for potential surgical planning.  She has significant mechanical symptoms that should be treatable with surgery.  Recheck after MRI.Marland Kitchen  PDMP not reviewed this encounter. Orders Placed This Encounter  Procedures  . [place] LIMITED JOINT SPACE STRUCTURES LOW RIGHT(NO LINKED CHARGES)    Order Specific Question:   Reason for Exam (SYMPTOM  OR DIAGNOSIS REQUIRED)    Answer:   R knee pain    Order Specific Question:   Preferred imaging location?    Answer:   [facility]  . DG Knee AP/LAT W/Sunrise Right    Standing Status:   Future    Number of Occurrences:   1    Standing Expiration Date:   07/13/2021    Order Specific Question:   Reason for Exam (SYMPTOM  OR DIAGNOSIS REQUIRED)    Answer:   eval knee pain medially    Order Specific Question:   Is patient pregnant?    Answer:   No  Order Specific Question:   Preferred imaging location?    Answer:   [facility]    Order Specific Question:   Radiology Contrast Protocol - do NOT remove file path    Answer:   \\[email or web address]s.pdf  . MR Knee Right Wo Contrast    Standing  Status:   Future    Standing Expiration Date:   07/13/2021    Order Specific Question:   What is the patient's sedation requirement?    Answer:   No Sedation    Order Specific Question:   Does the patient have a pacemaker or implanted devices?    Answer:   No    Order Specific Question:   Preferred imaging location?    Answer:   [facility] (table limit-350lbs)    Order Specific Question:   Radiology Contrast Protocol - do NOT remove file path    Answer:   \\[email or web address]\Radiant\[email or web address].PDF   No orders of the defined types were placed in this encounter.   Discussed warning signs or symptoms. Please see discharge instructions. Patient expresses understanding.   The above documentation has been reviewed and is accurate and complete Clementeen Graham, M.D.

## 2020-07-13 NOTE — Patient Instructions (Signed)
Thank you for coming in today.  Plan for xray and MRI soon to evaluate possible Meniscus tear.   Recheck after MRI.    Meniscus Tear  A meniscus tear is a knee injury that happens when a piece of the meniscus is torn. The meniscus is a thick, rubbery, wedge-shaped cartilage in the knee. Two menisci are located in each knee. They sit between the upper bone (femur) and lower bone (tibia) that make up the knee joint. Each meniscus acts as a shock absorber for the knee. A torn meniscus is one of the most common types of knee injuries. This injury can range from mild to severe. Surgery may be needed to repair a severe tear. What are the causes? This condition may be caused by any kneeling, squatting, twisting, or pivoting movement. Sports-related injuries are the most common cause. These often occur from:  Running and stopping suddenly. ? Changing direction. ? Being tackled or knocked off your feet.  Lifting or carrying heavy weights. As people get older, their menisci get thinner and weaker. In these people, tears can happen more easily, such as from climbing stairs. What increases the risk? You are more likely to develop this condition if you:  Play contact sports.  Have a job that requires kneeling or squatting.  Are female.  Are over 86 years old. What are the signs or symptoms? Symptoms of this condition include:  Knee pain, especially at the side of the knee joint. You may feel pain when the injury occurs, or you may only hear a pop and feel pain later.  A feeling that your knee is clicking, catching, locking, or giving way.  Not being able to fully bend or extend your knee.  Bruising or swelling in your knee. How is this diagnosed? This condition may be diagnosed based on your symptoms and a physical exam. You may also have tests, such as:  X-rays.  MRI.  A procedure to look inside your knee with a narrow surgical telescope (arthroscopy). You may be referred to a knee  specialist (orthopedic surgeon). How is this treated? Treatment for this injury depends on the severity of the tear. Treatment for a mild tear may include:  Rest.  Medicine to reduce pain and swelling. This is usually a nonsteroidal anti-inflammatory drug (NSAID), like ibuprofen.  A knee brace, sleeve, or wrap.  Using crutches or a walker to keep weight off your knee and to help you walk.  Exercises to strengthen your knee (physical therapy). You may need surgery if you have a severe tear or if other treatments are not working. Follow these instructions at home: If you have a brace, sleeve, or wrap:  Wear it as told by your health care provider. Remove it only as told by your health care provider.  Loosen the brace, sleeve, or wrap if your toes tingle, become numb, or turn cold and blue.  Keep the brace, sleeve, or wrap clean and dry.  If the brace, sleeve, or wrap is not waterproof: ? Do not let it get wet. ? Cover it with a watertight covering when you take a bath or shower. Managing pain and swelling   Take over-the-counter and prescription medicines only as told by your health care provider.  If directed, put ice on your knee: ? If you have a removable brace, sleeve, or wrap, remove it as told by your health care provider. ? Put ice in a plastic bag. ? Place a towel between your skin and the bag. ?  Leave the ice on for 20 minutes, 2-3 times per day.  Move your toes often to avoid stiffness and to lessen swelling.  Raise (elevate) the injured area above the level of your heart while you are sitting or lying down. Activity  Do not use the injured limb to support your body weight until your health care provider says that you can. Use crutches or a walker as told by your health care provider.  Return to your normal activities as told by your health care provider. Ask your health care provider what activities are safe for you.  Perform range-of-motion exercises only as  told by your health care provider.  Begin doing exercises to strengthen your knee and leg muscles only as told by your health care provider. After you recover, your health care provider may recommend these exercises to help prevent another injury. General instructions  Use a knee brace, sleeve, or wrap as told by your health care provider.  Ask your health care provider when it is safe to drive if you have a brace, sleeve, or wrap on your knee.  Do not use any products that contain nicotine or tobacco, such as cigarettes, e-cigarettes, and chewing tobacco. If you need help quitting, ask your health care provider.  Ask your health care provider if the medicine prescribed to you: ? Requires you to avoid driving or using heavy machinery. ? Can cause constipation. You may need to take these actions to prevent or treat constipation:  Drink enough fluid to keep your urine pale yellow.  Take over-the-counter or prescription medicines.  Eat foods that are high in fiber, such as beans, whole grains, and fresh fruits and vegetables.  Limit foods that are high in fat and processed sugars, such as fried or sweet foods.  Keep all follow-up visits as told by your health care provider. This is important. Contact a health care provider if:  You have a fever.  Your knee becomes red, tender, or swollen.  Your pain medicine is not helping.  Your symptoms get worse or do not improve after 2 weeks of home care. Summary  A meniscus tear is a knee injury that happens when a piece of the meniscus is torn.  Treatment for this injury depends on the severity of the tear. You may need surgery if you have a severe tear or if other treatments are not working.  Rest, ice, and raise (elevate) your injured knee as told by your health care provider. This will help lessen pain and swelling.  Contact a health care provider if you have new symptoms, or your symptoms get worse or do not improve after 2 weeks of  home care.  Keep all follow-up visits as told by your health care provider. This is important. This information is not intended to replace advice given to you by your health care provider. Make sure you discuss any questions you have with your health care provider. Document Revised: 06/23/2018 Document Reviewed: 06/23/2018 Elsevier Patient Education  2020 [employer].

## 2020-07-18 ENCOUNTER — Ambulatory Visit (INDEPENDENT_AMBULATORY_CARE_PROVIDER_SITE_OTHER): Payer: BC Managed Care – PPO

## 2020-07-18 ENCOUNTER — Encounter: Payer: Self-pay | Admitting: Family Medicine

## 2020-07-18 ENCOUNTER — Other Ambulatory Visit: Payer: Self-pay

## 2020-07-18 DIAGNOSIS — M25561 Pain in right knee: Secondary | ICD-10-CM

## 2020-07-18 DIAGNOSIS — S83281A Other tear of lateral meniscus, current injury, right knee, initial encounter: Secondary | ICD-10-CM

## 2020-07-18 DIAGNOSIS — S83211A Bucket-handle tear of medial meniscus, current injury, right knee, initial encounter: Secondary | ICD-10-CM | POA: Diagnosis not present

## 2020-07-24 ENCOUNTER — Encounter: Payer: Self-pay | Admitting: Family Medicine

## 2020-07-24 NOTE — Progress Notes (Signed)
X-ray right knee looks pretty normal to radiology.

## 2020-07-24 NOTE — Progress Notes (Signed)
MRI shows large bucket-handle tear.  Dr. Katrinka Blazing already referred you to orthopedic surgery for surgical repair which is necessary for this injury.

## 2020-07-27 DIAGNOSIS — S83241A Other tear of medial meniscus, current injury, right knee, initial encounter: Secondary | ICD-10-CM | POA: Diagnosis not present

## 2020-08-11 DIAGNOSIS — G8918 Other acute postprocedural pain: Secondary | ICD-10-CM | POA: Diagnosis not present

## 2020-08-11 DIAGNOSIS — S83211A Bucket-handle tear of medial meniscus, current injury, right knee, initial encounter: Secondary | ICD-10-CM | POA: Diagnosis not present

## 2020-08-11 DIAGNOSIS — X509XXA Other and unspecified overexertion or strenuous movements or postures, initial encounter: Secondary | ICD-10-CM | POA: Diagnosis not present

## 2020-08-11 DIAGNOSIS — X58XXXA Exposure to other specified factors, initial encounter: Secondary | ICD-10-CM | POA: Diagnosis not present

## 2020-08-22 DIAGNOSIS — Z9889 Other specified postprocedural states: Secondary | ICD-10-CM | POA: Diagnosis not present

## 2020-08-22 DIAGNOSIS — R531 Weakness: Secondary | ICD-10-CM | POA: Diagnosis not present

## 2020-08-22 DIAGNOSIS — S83241A Other tear of medial meniscus, current injury, right knee, initial encounter: Secondary | ICD-10-CM | POA: Diagnosis not present

## 2020-09-18 DIAGNOSIS — U071 COVID-19: Secondary | ICD-10-CM | POA: Diagnosis not present

## 2020-12-26 DIAGNOSIS — Z20822 Contact with and (suspected) exposure to covid-19: Secondary | ICD-10-CM | POA: Diagnosis not present

## 2021-01-11 DIAGNOSIS — Z1152 Encounter for screening for COVID-19: Secondary | ICD-10-CM | POA: Diagnosis not present

## 2021-01-19 ENCOUNTER — Ambulatory Visit: Payer: BC Managed Care – PPO | Admitting: Family Medicine

## 2021-01-19 DIAGNOSIS — Z0289 Encounter for other administrative examinations: Secondary | ICD-10-CM

## 2021-01-19 NOTE — Progress Notes (Deleted)
OFFICE VISIT  01/19/2021  CC: No chief complaint on file.   HPI:    Patient is a 25 y.o. Caucasian female who presents for vaginal complaints.  Past Medical History:  Diagnosis Date  . ADD (attention deficit disorder) 05/11/2013   with ODD/conduct disorder features per prior PCP notes.  No longer required meds after age 37.  Marland Kitchen GAD (generalized anxiety disorder)   . Menorrhagia 01/26/2015   resolved as of age 74 or so.  . Panic disorder     No past surgical history on file.  Outpatient Medications Prior to Visit  Medication Sig Dispense Refill  . buPROPion (WELLBUTRIN XL) 150 MG 24 hr tablet Take 1 tablet (150 mg total) by mouth daily. 30 tablet 1  . FLUoxetine (PROZAC) 20 MG capsule Take 20 mg by mouth daily.    . naproxen (NAPROSYN) 500 MG tablet Take 1 tablet (500 mg total) by mouth 2 (two) times daily. 30 tablet 0   No facility-administered medications prior to visit.    No Known Allergies  ROS As per HPI  PE: Vitals with BMI 07/13/2020 07/10/2020 06/30/2020  Height 5\' 7"  5\' 7"  5\' 7"   Weight 115 lbs 13 oz 116 lbs 10 oz 116 lbs  BMI 18.13 18.26 18.16  Systolic 110 114  Diastolic 78 76 74  Pulse 86 91 92     ***  LABS:  None  IMPRESSION AND PLAN:  No problem-specific Assessment & Plan notes found for this encounter.   An After Visit Summary was printed and given to the patient.  FOLLOW UP: No follow-ups on file.  Signed:  , MD           01/19/2021

## 2021-02-08 DIAGNOSIS — Z1152 Encounter for screening for COVID-19: Secondary | ICD-10-CM | POA: Diagnosis not present

## 2021-02-15 ENCOUNTER — Ambulatory Visit (INDEPENDENT_AMBULATORY_CARE_PROVIDER_SITE_OTHER): Payer: BC Managed Care – PPO

## 2021-02-15 ENCOUNTER — Ambulatory Visit
Admission: EM | Admit: 2021-02-15 | Discharge: 2021-02-15 | Disposition: A | Discharge status: Home / Self Care | Payer: BC Managed Care – PPO | Attending: Physician Assistant | Admitting: Physician Assistant

## 2021-02-15 DIAGNOSIS — M25562 Pain in left knee: Secondary | ICD-10-CM

## 2021-02-15 DIAGNOSIS — S86912A Strain of unspecified muscle(s) and tendon(s) at lower leg level, left leg, initial encounter: Secondary | ICD-10-CM

## 2021-02-15 DIAGNOSIS — Y9366 Activity, soccer: Secondary | ICD-10-CM | POA: Diagnosis not present

## 2021-02-15 DIAGNOSIS — R29898 Other symptoms and signs involving the musculoskeletal system: Secondary | ICD-10-CM | POA: Diagnosis not present

## 2021-02-15 NOTE — ED Triage Notes (Signed)
Was playing soccer yesterday and felt something pop in left knee

## 2021-02-15 NOTE — Discharge Instructions (Signed)
Return if any problems.

## 2021-02-17 NOTE — ED Provider Notes (Signed)
[facility]    CSN: [id number] Arrival date & time: 02/15/21  1037      History   Chief Complaint No chief complaint on file.   HPI Patricia Meyers is a 25 y.o. female.   Pt was playing soccer and heard her knee pop   The history is provided by the patient. No language interpreter was used.  Knee Pain Location:  Knee Injury: no   Knee location:  L knee Pain details:    Quality:  Aching   Radiates to:  Does not radiate   Severity:  Moderate   Timing:  Constant Chronicity:  New Relieved by:  Nothing Worsened by:  Nothing Ineffective treatments:  None tried Risk factors: no concern for non-accidental trauma     Past Medical History:  Diagnosis Date  . ADD (attention deficit disorder) 05/11/2013   with ODD/conduct disorder features per prior PCP notes.  No longer required meds after age 65.  Marland Kitchen GAD (generalized anxiety disorder)   . Menorrhagia 01/26/2015   resolved as of age 13 or so.  . Panic disorder     Patient Active Problem List   Diagnosis Date Noted  . Menorrhagia 01/26/2015  . Encounter for menstrual regulation 01/26/2015  . ADD (attention deficit disorder) 05/11/2013    No past surgical history on file.  OB History    Gravida  0   Para  0   Term  0   Preterm  0   AB  0   Living  0     SAB  0   IAB  0   Ectopic  0   Multiple  0   Live Births               Home Medications    Prior to Admission medications   Medication Sig Start Date End Date Taking? Authorizing Provider  buPROPion (WELLBUTRIN XL) 150 MG 24 hr tablet Take 1 tablet (150 mg total) by mouth daily. 04/24/20 02/15/21  McGowen, Maryjean Morn, MD  FLUoxetine (PROZAC) 20 MG capsule Take 20 mg by mouth daily. 04/24/20 02/15/21  [provider]    Family History Family History  Problem Relation Age of Onset  . Hypertension Mother   . Cancer Maternal Grandmother   . Lung cancer Maternal Grandmother   . Cancer Maternal Grandfather   . Heart disease  Maternal Grandfather   . Lung cancer Maternal Grandfather   . Alcohol abuse Maternal Grandfather   . Arthritis Paternal Grandfather   . Hearing loss Paternal Grandfather   . Asthma Paternal Grandfather     Social History Social History   Tobacco Use  . Smoking status: Never Smoker  . Smokeless tobacco: Never Used  Vaping Use  . Vaping Use: Never used  Substance Use Topics  . Alcohol use: No  . Drug use: No     Allergies   Patient has no known allergies.   Review of Systems Review of Systems  All other systems reviewed and are negative.    Physical Exam Triage Vital Signs ED Triage Vitals  Enc Vitals Group     BP 02/15/21 1047 105/72     Pulse Rate 02/15/21 1047 (!) 111     Resp 02/15/21 1047 18     Temp 02/15/21 1047 98.3 F (36.8 C)     Temp Source 02/15/21 1047 Oral     SpO2 02/15/21 1047 98 %     Weight --      Height --  Head Circumference --      Peak Flow --      Pain Score 02/15/21 1048 5     Pain Loc --      Pain Edu? --      Excl. in GC? --    No data found.  Updated Vital Signs BP 105/72 (BP Location: Right Arm)   Pulse (!) 111   Temp 98.3 F (36.8 C) (Oral)   Resp 18   LMP 01/29/2021   SpO2 98%   Visual Acuity Right Eye Distance:   Left Eye Distance:   Bilateral Distance:    Right Eye Near:   Left Eye Near:    Bilateral Near:     Physical Exam Vitals reviewed.  Musculoskeletal:        General: Tenderness present. No swelling.     Comments: Pain with range of motoin, nv and ns intact   Skin:    General: Skin is warm.  Neurological:     General: No focal deficit present.  Psychiatric:        Mood and Affect: Mood normal.      UC Treatments / Results  Labs (all labs ordered are listed, but only abnormal results are displayed) Labs Reviewed - No data to display  EKG   Radiology DG Knee Complete 4 Views Left  Result Date: 02/15/2021 CLINICAL DATA:  Was playing soccer yesterday and felt something pop in left  knee EXAM: LEFT KNEE - COMPLETE 4+ VIEW COMPARISON:  None. FINDINGS: No evidence of fracture, dislocation, or joint effusion. No evidence of arthropathy or other focal bone abnormality. Soft tissues are unremarkable. IMPRESSION: Negative. Electronically Signed   By: Emmaline Kluver M.D.   On: 02/15/2021 11:08    Procedures Procedures (including critical care time)  Medications Ordered in UC Medications - No data to display  Initial Impression / Assessment and Plan / UC Course  I have reviewed the triage vital signs and the nursing notes.  Pertinent labs & imaging results that were available during my care of the patient were reviewed by me and considered in my medical decision making (see chart for details).     MDM:  Xray no frcture.  Pt advised follow up with ortho for recheck  Final Clinical Impressions(s) / UC Diagnoses   Final diagnoses:  Knee strain, left, initial encounter     Discharge Instructions     Return if any problems.   ED Prescriptions    None     PDMP not reviewed this encounter.  An After Visit Summary was printed and given to the patient.    Elson Areas, [place] 02/17/21 [phone]

## 2021-03-23 ENCOUNTER — Encounter: Payer: Self-pay | Admitting: Emergency Medicine

## 2021-03-23 ENCOUNTER — Ambulatory Visit
Admission: EM | Admit: 2021-03-23 | Discharge: 2021-03-23 | Disposition: A | Discharge status: Home / Self Care | Payer: BC Managed Care – PPO | Attending: Emergency Medicine | Admitting: Emergency Medicine

## 2021-03-23 ENCOUNTER — Other Ambulatory Visit: Payer: Self-pay

## 2021-03-23 DIAGNOSIS — R197 Diarrhea, unspecified: Secondary | ICD-10-CM | POA: Diagnosis not present

## 2021-03-23 DIAGNOSIS — R112 Nausea with vomiting, unspecified: Secondary | ICD-10-CM | POA: Diagnosis not present

## 2021-03-23 MED ORDER — ONDANSETRON 4 MG PO TBDP
4.0000 mg | ORAL_TABLET | Freq: Once | ORAL | Status: AC
  Administered 2021-03-23: 4 mg via ORAL

## 2021-03-23 MED ORDER — ONDANSETRON HCL 4 MG PO TABS: 4.0000 mg | ORAL_TABLET | Freq: Four times a day (QID) | ORAL | 0 refills | Status: DC

## 2021-03-23 NOTE — Discharge Instructions (Signed)

## 2021-03-23 NOTE — ED Triage Notes (Signed)
N/V/D all day today.  Needs work note

## 2021-03-23 NOTE — ED Provider Notes (Signed)
[facility]   [id number] 03/23/21 Arrival Time: 1535  CC: ABDOMINAL DISCOMFORT  SUBJECTIVE:  Patricia Meyers is a 25 y.o. female who presents with complaint of nausea, several episodes of vomiting and watery diarrhea that began today.  Denies a precipitating event, trauma, close contacts with similar symptoms, recent travel or antibiotic use.  Denies abdominal pain.  Has tried pepto without relief.  Symptoms made worse with eating, but tolerating small amounts of water.  Reports similar symptoms in the past.   Denies fever, chills, chest pain, SOB, constipation, hematochezia, melena, dysuria, difficulty urinating, increased frequency or urgency, flank pain, loss of bowel or bladder function, vaginal discharge, vaginal odor, vaginal bleeding, dyspareunia, pelvic pain.     ROS: As per HPI.  All other pertinent ROS negative.     Past Medical History:  Diagnosis Date  . ADD (attention deficit disorder) 05/11/2013   with ODD/conduct disorder features per prior PCP notes.  No longer required meds after age 82.  Marland Kitchen GAD (generalized anxiety disorder)   . Menorrhagia 01/26/2015   resolved as of age 45 or so.  . Panic disorder    History reviewed. No pertinent surgical history. No Known Allergies No current facility-administered medications on file prior to encounter.   Current Outpatient Medications on File Prior to Encounter  Medication Sig Dispense Refill  . [DISCONTINUED] buPROPion (WELLBUTRIN XL) 150 MG 24 hr tablet Take 1 tablet (150 mg total) by mouth daily. 30 tablet 1  . [DISCONTINUED] FLUoxetine (PROZAC) 20 MG capsule Take 20 mg by mouth daily.     Social History   Socioeconomic History  . Marital status: Single    Spouse name: Not on file  . Number of children: Not on file  . Years of education: Not on file  . Highest education level: Not on file  Occupational History  . Not on file  Tobacco Use  . Smoking status: Never Smoker  . Smokeless tobacco: Never Used   Vaping Use  . Vaping Use: Never used  Substance and Sexual Activity  . Alcohol use: No  . Drug use: No  . Sexual activity: Yes  Other Topics Concern  . Not on file  Social History Narrative   Single, living with a friend as of 05/2018.   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   No tobacco.   Alcohol: none.   No drugs.   She is homosexual, identifies herself as female.   Social Determinants of Health   Financial Resource Strain: Not on file  Food Insecurity: Not on file  Transportation Needs: Not on file  Physical Activity: Not on file  Stress: Not on file  Social Connections: Not on file  Intimate Partner Violence: Not on file   Family History  Problem Relation Age of Onset  . Hypertension Mother   . Cancer Maternal Grandmother   . Lung cancer Maternal Grandmother   . Cancer Maternal Grandfather   . Heart disease Maternal Grandfather   . Lung cancer Maternal Grandfather   . Alcohol abuse Maternal Grandfather   . Arthritis Paternal Grandfather   . Hearing loss Paternal Grandfather   . Asthma Paternal Grandfather      OBJECTIVE:  Vitals:   03/23/21 1556  BP: 110/79  Pulse: (!) 104  Resp: 17  Temp: 98.6 F (37 C)  TempSrc: Oral  SpO2: 98%    General appearance: Alert; NAD HEENT: NCAT.  Oropharynx clear.  Lungs: clear to auscultation bilaterally without adventitious breath  sounds Heart: regular rate and rhythm.   Abdomen: soft, non-distended; normal active bowel sounds; non-tender to light and deep palpation; nontender at McBurney's point; no guarding Extremities: no edema; symmetrical with no gross deformities Skin: warm and dry Neurologic: normal gait Psychological: alert and cooperative; normal mood and affect  ASSESSMENT & PLAN:  1. Nausea vomiting and diarrhea     Meds ordered this encounter  Medications  . ondansetron (ZOFRAN-ODT) disintegrating tablet 4 mg  . ondansetron (ZOFRAN) 4 MG tablet    Sig: Take 1 tablet (4 mg total)  by mouth every 6 (six) hours.    Dispense:  12 tablet    Refill:  0    Order Specific Question:   Supervising Provider    Answer:   Eustace Moore [[id number]]     Get rest and drink fluids Zofran prescribed.  Take as directed.    DIET Instructions:  30 minutes after taking nausea medicine, begin with sips of clear liquids. If able to hold down 2 - 4 ounces for 30 minutes, begin drinking more. Increase your fluid intake to replace losses. Clear liquids only for 24 hours (water, tea, sport drinks, clear flat ginger ale or cola and juices, broth, jello, popsicles, ect). Advance to bland foods, applesauce, rice, baked or boiled chicken, ect. Avoid milk, greasy foods and anything that doesn't agree with you.  If you experience new or worsening symptoms return or go to ER such as fever, chills, nausea, vomiting, diarrhea, bloody or dark tarry stools, constipation, urinary symptoms, worsening abdominal discomfort, symptoms that do not improve with medications, inability to keep fluids down, etc...  Reviewed expectations re: course of current medical issues. Questions answered. Outlined signs and symptoms indicating need for more acute intervention. Patient verbalized understanding. After Visit Summary given.   Rennis Harding, PA-C 03/23/21 1615

## 2021-06-20 ENCOUNTER — Ambulatory Visit: Payer: Self-pay | Admitting: Family Medicine

## 2021-07-24 ENCOUNTER — Encounter: Payer: Self-pay | Admitting: Family Medicine

## 2021-07-25 NOTE — Telephone Encounter (Signed)
Pt was not evaluated by PCP regarding this issue, please advise on work note.

## 2021-07-25 NOTE — Telephone Encounter (Signed)
OK to do work excuse for her as requested.

## 2021-07-25 NOTE — Telephone Encounter (Signed)
Please Advise on extension for work note

## 2021-07-25 NOTE — Telephone Encounter (Signed)
Fine.  But if she ends up requesting note to extend again after this then I'll need to see her.-thx

## 2021-11-11 ENCOUNTER — Encounter: Payer: Self-pay | Admitting: Family Medicine

## 2021-11-12 ENCOUNTER — Encounter: Payer: Self-pay | Admitting: Family Medicine

## 2021-11-12 ENCOUNTER — Telehealth (INDEPENDENT_AMBULATORY_CARE_PROVIDER_SITE_OTHER): Payer: Self-pay | Admitting: Family Medicine

## 2021-11-12 DIAGNOSIS — F411 Generalized anxiety disorder: Secondary | ICD-10-CM

## 2021-11-12 DIAGNOSIS — F410 Panic disorder [episodic paroxysmal anxiety] without agoraphobia: Secondary | ICD-10-CM

## 2021-11-12 MED ORDER — FLUOXETINE HCL 20 MG PO CAPS: 20.0000 mg | ORAL_CAPSULE | Freq: Every day | ORAL | 1 refills | Status: DC

## 2021-11-12 NOTE — Progress Notes (Signed)
Virtual Visit via Video Note  I connected with Patricia Meyers on 11/12/21 at  4:15 PM EST by a video enabled telemedicine application and verified that I am speaking with the correct person using two identifiers.  Location patient: home, [place] Location provider:work or home office Persons participating in the virtual visit: patient, provider  I discussed the limitations of evaluation and management by telemedicine and the availability of in person appointments. The patient expressed understanding and agreed to proceed.  HPI: 25 y/o female being seen today for f/u GAD, hx of panic attacks. I last saw her for this problem 04/24/20. A/P as of that visit: "GAD, panic attacks.  She is improved but room for improvement, particularly regarding mild mood sx's. Has impaired libido due to fluoxetine.  Discussed options of switch to diff SSRI vs add wellbutrin to fluox. Opted for continuing fluoxetine 20 mg qd, add bupropion/wellbutrin 150mg  po qd".  INTERIM HX: Is been quite a while since I saw . She stopped taking her fluoxetine around 11 months ago.  Since then she has noted gradual return of her chronic anxiety, and most recently with panic attacks daily.  Describes unprovoked onset of numbness feeling all over, fast heart rate, mild dizziness, sweaty palms. Denies depressed mood. She never started the Wellbutrin we had planned last time. She seems to be functioning well and is working as a [place], has plans for school after she pays her car off.  ROS: See pertinent positives and negatives per HPI.  Past Medical History:  Diagnosis Date   ADD (attention deficit disorder) 05/11/2013   with ODD/conduct disorder features per prior PCP notes.  No longer required meds after age 32.   GAD (generalized anxiety disorder)    Menorrhagia 01/26/2015   resolved as of age 68 or so.   Panic disorder     History reviewed. No pertinent surgical history.   Current Outpatient Medications:    FLUoxetine  (PROZAC) 20 MG capsule, Take 1 capsule (20 mg total) by mouth daily., Disp: 30 capsule, Rfl: 1  EXAM:  VITALS per patient if applicable:  GENERAL: alert, oriented, appears well and in no acute distress  HEENT: atraumatic, conjunttiva clear, no obvious abnormalities on inspection of external nose and ears  NECK: normal movements of the head and neck  LUNGS: on inspection no signs of respiratory distress, breathing rate appears normal, no obvious gross SOB, gasping or wheezing  CV: no obvious cyanosis  MS: moves all visible extremities without noticeable abnormality  PSYCH/NEURO: pleasant and cooperative, no obvious depression or anxiety, speech and thought processing grossly intact  ASSESSMENT AND PLAN:  Discussed the following assessment and plan:  GAD, panic disorder. Restart fluoxetine 20 mg/day.  In the past decreased libido was attributed this to this medicine for her.  We had planned to add bupropion but she never started this.  We will have to see how this goes  Therapeutic expectations and side effect profile of medication discussed today.  Patient's questions answered.  I discussed the assessment and treatment plan with the patient. The patient was provided an opportunity to ask questions and all were answered. The patient agreed with the plan and demonstrated an understanding of the instructions.   F/u: 3-4 wks  Signed:  12, MD           11/12/2021

## 2021-11-19 ENCOUNTER — Telehealth: Payer: Self-pay | Admitting: Family Medicine

## 2021-12-12 ENCOUNTER — Other Ambulatory Visit: Payer: Self-pay | Admitting: Family Medicine

## 2021-12-25 ENCOUNTER — Encounter: Payer: Self-pay | Admitting: Family Medicine

## 2022-01-04 ENCOUNTER — Telehealth: Payer: Self-pay | Admitting: Family Medicine

## 2022-01-07 ENCOUNTER — Telehealth (INDEPENDENT_AMBULATORY_CARE_PROVIDER_SITE_OTHER): Payer: Self-pay | Admitting: Family Medicine

## 2022-01-07 ENCOUNTER — Other Ambulatory Visit: Payer: Self-pay

## 2022-01-07 ENCOUNTER — Encounter: Payer: Self-pay | Admitting: Family Medicine

## 2022-01-07 VITALS — Wt 112.0 lb

## 2022-01-07 DIAGNOSIS — F410 Panic disorder [episodic paroxysmal anxiety] without agoraphobia: Secondary | ICD-10-CM

## 2022-01-07 DIAGNOSIS — F411 Generalized anxiety disorder: Secondary | ICD-10-CM

## 2022-01-07 MED ORDER — FLUOXETINE HCL 40 MG PO CAPS: 40.0000 mg | ORAL_CAPSULE | Freq: Every day | ORAL | 1 refills | Status: DC

## 2022-01-07 NOTE — Progress Notes (Signed)
Virtual Visit via Video Note  I connected with Patricia Meyers on 01/07/22 at  1:00 PM EST by a video enabled telemedicine application and verified that I am speaking with the correct person using two identifiers.  Location patient: [place] Location provider:work or home office Persons participating in the virtual visit: patient, provider  I discussed the limitations and requested verbal permission for telemedicine visit. The patient expressed understanding and agreed to proceed.   HPI: 26 y/o female being seen today for f/u anxiety, hx of panic attacks.  A/P as of last visit on 11/12/21: "GAD, panic disorder. Restart fluoxetine 20 mg/day.  In the past decreased libido was attributed this to this medicine for her.  We had planned to add bupropion but she never started this.  We will have to see how this goes"  INTERIM HX: Patient was instructed to follow-up 3 to 4 weeks after last visit but did not.  She has been compliant with fluoxetine 20 mg a day.  Does not have any sexual side effects. Bubba Hales is still struggling with significant chronic worry, feeling keyed up and irritable at times.  Occasionally gets into some panic--describes some lightheadedness, feeling heart racing, getting sweaty, really feels like something bad is happening at that point. Peaks in about 5 to 10 minutes and resolves over 30. Denies any persistent depressed mood but admits sometimes her anxiety brings her down. Says fluoxetine at the 20 mg dose in the remote past did help but she feels like its not helping at this point. Still working her job as a Educational psychologist. Not attending any school at this time.  Immunization History  Administered Date(s) Administered   DTaP 04/06/1998, 06/06/1998, 08/08/1998, 05/08/1999, 10/14/2002   H1N1 10/27/2008   HPV Quadrivalent 08/07/2011, 01/31/2012, 05/10/2013   Hepatitis A, Ped/Adol-2 Dose 12/25/2016   Hepatitis B 08/21/1998, 04/03/1998, 11/08/1998   HiB (PRP-OMP) 04/06/1998, 06/06/1998,  05/08/1999   IPV 04/06/1998, 06/06/1998, 05/08/1999, 10/14/2002   Influenza Whole 10/15/2007, 02/20/2010, 01/31/2012, 11/30/2012   Influenza,inj,Quad PF,6+ Mos 08/11/2019   MMR 02/06/1999, 10/14/2002, 12/14/2002   Meningococcal B, OMV 12/25/2016   Meningococcal Conjugate 05/10/2013, 03/08/2014   Tdap 07/05/2009   Varicella 02/06/1999, 11/03/2013     ROS: See pertinent positives and negatives per HPI.  Past Medical History:  Diagnosis Date   ADD (attention deficit disorder) 05/11/2013   with ODD/conduct disorder features per prior PCP notes.  No longer required meds after age 89.   GAD (generalized anxiety disorder)    Menorrhagia 01/26/2015   resolved as of age 26 or so.   Panic disorder     No past surgical history on file.   Current Outpatient Medications:    FLUoxetine (PROZAC) 20 MG capsule, TAKE ONE CAPSULE BY MOUTH ONCE DAILY., Disp: 30 capsule, Rfl: 0  EXAM:  VITALS per patient if applicable:  Vitals with BMI 01/07/2022 03/23/2021 02/15/2021  Height - - -  Weight 112 lbs - -  BMI - - -  Systolic - 989 211  Diastolic - 79 72  Pulse - 104 111    GENERAL: alert, oriented, appears well and in no acute distress  HEENT: atraumatic, conjunttiva clear, no obvious abnormalities on inspection of external nose and ears  NECK: normal movements of the head and neck  LUNGS: on inspection no signs of respiratory distress, breathing rate appears normal, no obvious gross SOB, gasping or wheezing  CV: no obvious cyanosis  MS: moves all visible extremities without noticeable abnormality  PSYCH/NEURO: pleasant and cooperative, no obvious depression or  anxiety, speech and thought processing grossly intact  LABS: none today  ASSESSMENT AND PLAN:  Discussed the following assessment and plan:  GAD, panic attacks.  Still a significant problem. Increase fluoxetine to 40 mg a day.  I discussed the assessment and treatment plan with the patient. The patient was provided an  opportunity to ask questions and all were answered. The patient agreed with the plan and demonstrated an understanding of the instructions.   F/u: 1 mo  Signed:  Crissie Sickles, MD           01/07/2022

## 2022-01-22 ENCOUNTER — Encounter: Payer: Self-pay | Admitting: Family Medicine

## 2022-01-22 IMAGING — MR MR KNEE*R* W/O CM
7 series · 40 of 40 positions shown · non-contrast
Comparison: Radiographs 07/13/2020

CLINICAL DATA: Medial knee pain and swelling for 3 weeks. No acute
injury or prior relevant surgery.

EXAM:
MRI OF THE RIGHT KNEE WITHOUT CONTRAST
TECHNIQUE: Multiplanar, multisequence MR imaging of the knee was performed. No
intravenous contrast was administered.

[Series 5: T2 fat-sat · axial · 4.0mm · 0.53mm/px · z∈[-54,+91]mm · 6 of 30 slices shown (1 of 3)]
[im 1/30]
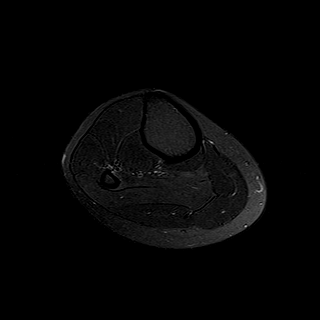
[im 6/30]
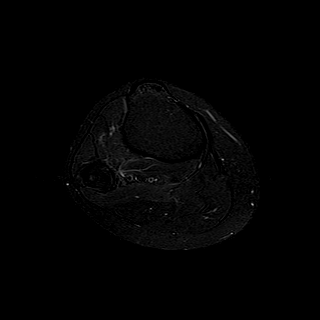
[im 12/30]
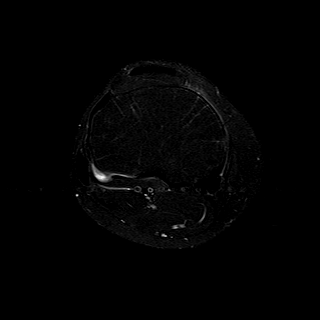
[im 18/30]
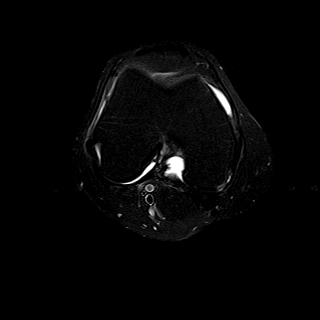
[im 24/30]
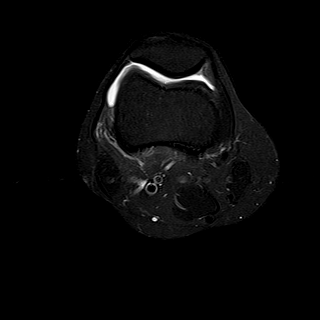
[im 30/30]
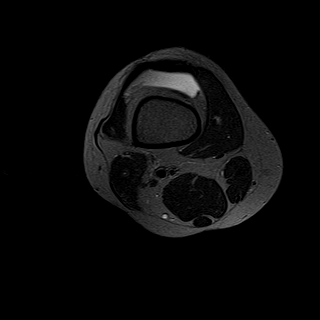

[Series 6: T1 · coronal · 4.0mm · 0.62mm/px · 6 of 25 slices shown]
[im 1/25]
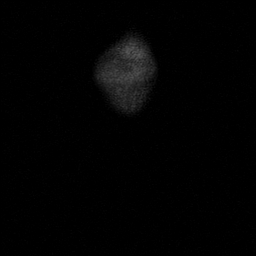
[im 5/25]
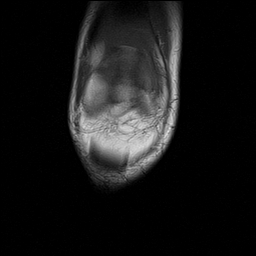
[im 10/25]
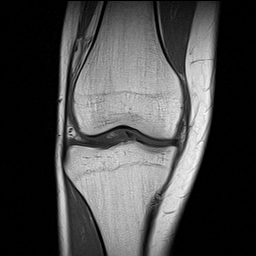
[im 15/25]
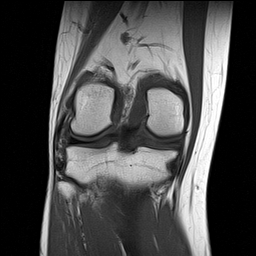
[im 20/25]
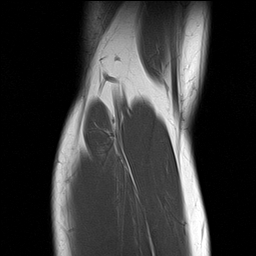
[im 25/25]
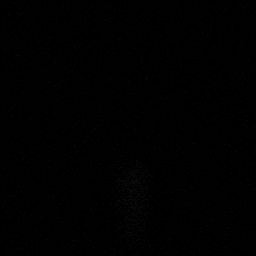

[Series 7: T2 fat-sat · coronal · 4.0mm · 0.62mm/px · 6 of 25 slices shown (2 of 3)]
[im 1/25]
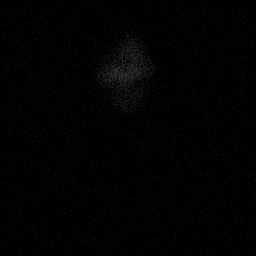
[im 5/25]
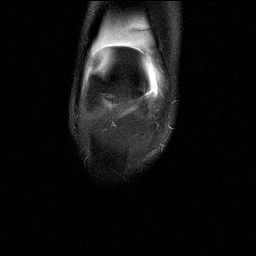
[im 10/25]
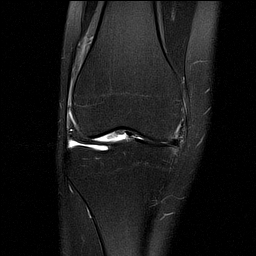
[im 15/25]
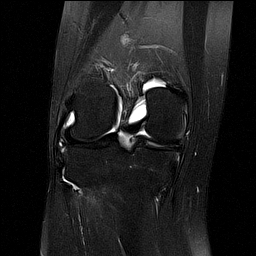
[im 20/25]
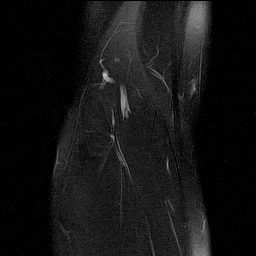
[im 25/25]
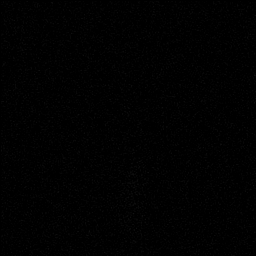

[Series 8: PD fat-sat · coronal · 4.0mm · 0.62mm/px · 6 of 25 slices shown (1 of 3)]
[im 1/25]
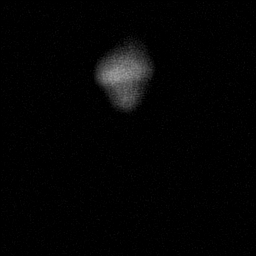
[im 5/25]
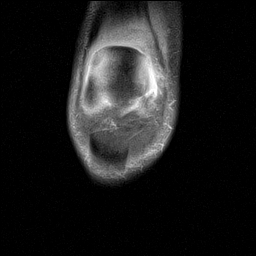
[im 10/25]
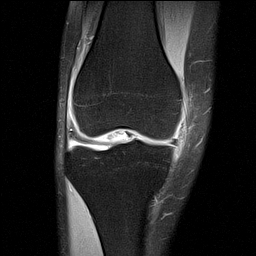
[im 15/25]
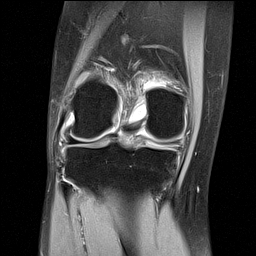
[im 20/25]
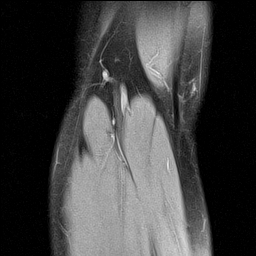
[im 25/25]
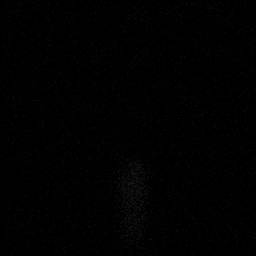

[Series 9: PD fat-sat · sagittal · 3.0mm · 0.62mm/px · 6 of 27 slices shown (2 of 3)]
[im 1/27]
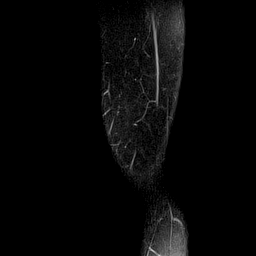
[im 6/27]
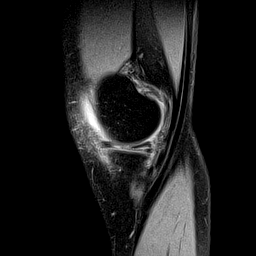
[im 11/27]
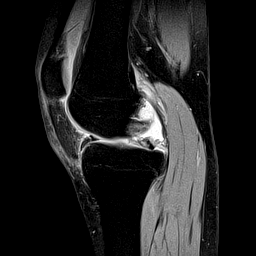
[im 16/27]
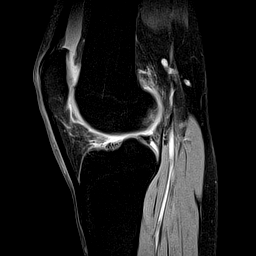
[im 21/27]
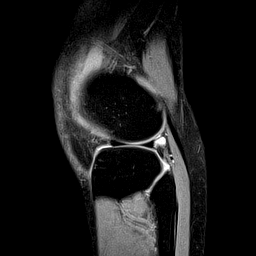
[im 27/27]
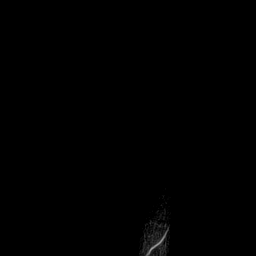

[Series 10: T2 fat-sat · sagittal · 3.0mm · 0.62mm/px · 6 of 27 slices shown (3 of 3)]
[im 1/27]
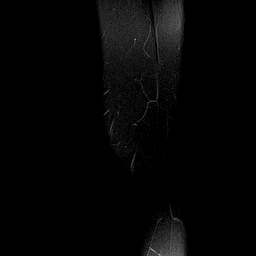
[im 6/27]
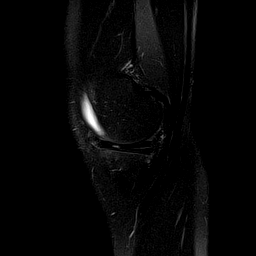
[im 11/27]
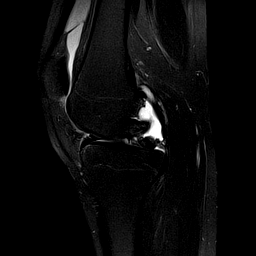
[im 16/27]
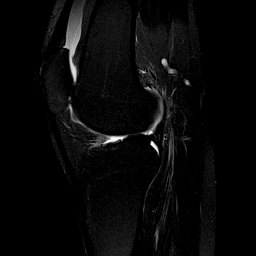
[im 21/27]
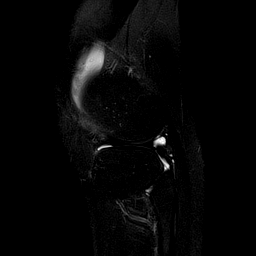
[im 27/27]
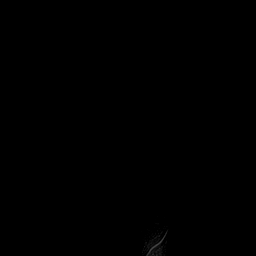

[Series 11: PD fat-sat · oblique · 2.0mm · 0.62mm/px · 4 of 19 slices shown (3 of 3)]
[im 1/19]
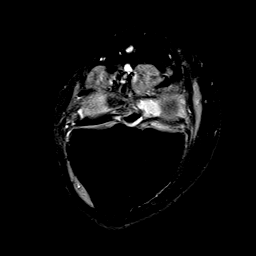
[im 7/19]
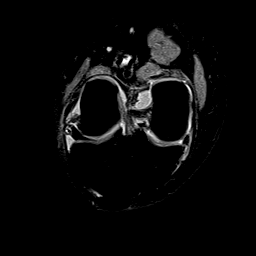
[im 13/19]
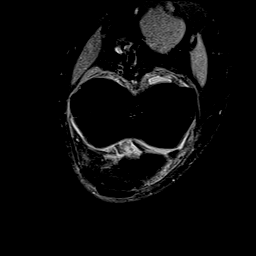
[im 19/19]
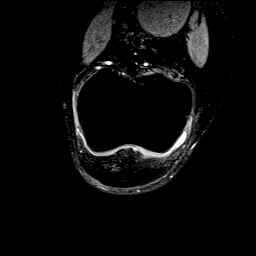

[40 of 40 positions shown; findings below may reference images not displayed]

FINDINGS: MENISCI

Medial meniscus: There is a large bucket-handle tear of the medial
meniscus, best seen on the coronal and sagittal images. In addition,
there is an oblique tear involving the undersurface of the posterior
horn, best seen on the sagittal images.

Lateral meniscus:  Intact with normal morphology.

LIGAMENTS

Cruciates:  Intact.

Collaterals:  Intact.

CARTILAGE

Patellofemoral:  Preserved.

Medial:  Minimal chondral surface irregularity without focal defect.

Lateral:  Preserved.

MISCELLANEOUS

Joint:  Moderate-sized joint effusion.

Popliteal Fossa:  Unremarkable. No significant Baker's cyst.

Extensor Mechanism:  Intact.

Bones:  No acute or significant extra-articular osseous findings.

Other: No other significant periarticular soft tissue findings.
IMPRESSION: 1. Large bucket-handle tear of the medial meniscus. Additional
oblique component involving the undersurface of the posterior horn.
2. The lateral meniscus, cruciate and collateral ligaments are
intact.
3. Moderate-sized joint effusion.  No acute osseous findings.

## 2022-02-27 ENCOUNTER — Encounter: Payer: Self-pay | Admitting: Family Medicine

## 2022-02-27 ENCOUNTER — Telehealth (INDEPENDENT_AMBULATORY_CARE_PROVIDER_SITE_OTHER): Payer: Self-pay | Admitting: Family Medicine

## 2022-02-27 ENCOUNTER — Other Ambulatory Visit: Payer: Self-pay

## 2022-02-27 DIAGNOSIS — F410 Panic disorder [episodic paroxysmal anxiety] without agoraphobia: Secondary | ICD-10-CM

## 2022-02-27 DIAGNOSIS — F411 Generalized anxiety disorder: Secondary | ICD-10-CM

## 2022-02-27 DIAGNOSIS — R002 Palpitations: Secondary | ICD-10-CM

## 2022-02-27 MED ORDER — PAROXETINE HCL 20 MG PO TABS: 20.0000 mg | ORAL_TABLET | Freq: Every day | ORAL | 1 refills | Status: DC

## 2022-02-27 MED ORDER — PROPRANOLOL HCL 20 MG PO TABS: ORAL_TABLET | ORAL | 1 refills | Status: DC

## 2022-02-27 NOTE — Progress Notes (Addendum)
Virtual Visit via Video Note ? ?I connected with pt on 02/27/22 at  1:20 PM EST by a video enabled telemedicine application and verified that I am speaking with the correct person using two identifiers. ? Location patient: [place] ?Location provider:work or home office ?Persons participating in the virtual visit: patient, provider ? ?I discussed the limitations and requested verbal permission for telemedicine visit. The patient expressed understanding and agreed to proceed. ? ?INTERIM HX: ? ?26 y/o female being seen today for "medicine not working". ?I last saw her 01/07/2022 for virtual visit. ?A/P as of that visit: ?"GAD, panic attacks.  Still a significant problem. ?Increase fluoxetine to 40 mg a day". ? ?She states she is not any better. ?Still with chronic worry that interferes with daily functioning.  She does still go to work but has abrupt onset of feeling panicky every day.  These attacks even occur when not at work.  She has not identified any specific trigger for them.  Intermittently she will feel like her heart skips or pauses briefly.  No shortness of breath, no dizziness, no nausea, no chest pain.  These sensations then further elevate her anxiety level. ?Sleep sounds okay overall.  Appetite is down some but denies any abdominal complaints. ?Denies any depressed mood, euphoria, excessive energy, or excessive goal-directed activity. ? ?ROS as above, plus--> no fevers, no wheezing, no cough, no HAs.  no rashes, no melena/hematochezia.  No polyuria or polydipsia.  No myalgias or arthralgias.  No focal weakness, paresthesias, or tremors.  No acute vision or hearing abnormalities.  No dysuria or unusual/new urinary urgency or frequency.  No recent changes in lower legs. ?No n/v/d or abd pain.  ? ? ?Immunization History  ?Administered Date(s) Administered  ? DTaP 04/06/1998, 06/06/1998, 08/08/1998, 05/08/1999, 10/14/2002  ? H1N1 10/27/2008  ? HPV Quadrivalent 08/07/2011, 01/31/2012, 05/10/2013  ? Hepatitis A,  Ped/Adol-2 Dose 12/25/2016  ? Hepatitis B Jul 16, 1998, 04/03/1998, 11/08/1998  ? HiB (PRP-OMP) 04/06/1998, 06/06/1998, 05/08/1999  ? IPV 04/06/1998, 06/06/1998, 05/08/1999, 10/14/2002  ? Influenza Whole 10/15/2007, 02/20/2010, 01/31/2012, 11/30/2012  ? Influenza,inj,Quad PF,6+ Mos 08/11/2019  ? MMR 02/06/1999, 10/14/2002, 12/14/2002  ? Meningococcal B, OMV 12/25/2016  ? Meningococcal Conjugate 05/10/2013, 03/08/2014  ? Tdap 07/05/2009  ? Varicella 02/06/1999, 11/03/2013  ? ? ? ?ROS: See pertinent positives and negatives per HPI. ? ?Past Medical History:  ?Diagnosis Date  ? ADD (attention deficit disorder) 05/11/2013  ? with ODD/conduct disorder features per prior PCP notes.  No longer required meds after age 60.  ? GAD (generalized anxiety disorder)   ? Menorrhagia 01/26/2015  ? resolved as of age 83 or so.  ? Panic disorder   ? ? ?History reviewed. No pertinent surgical history. ? ? ? ?Current Outpatient Medications:  ?  FLUoxetine (PROZAC) 40 MG capsule, Take 1 capsule (40 mg total) by mouth daily., Disp: 30 capsule, Rfl: 1 ? ?EXAM: ? ?VITALS per patient if applicable:  ?Vitals with BMI 01/07/2022 03/23/2021 02/15/2021  ?Height - - -  ?Weight 112 lbs - -  ?BMI - - -  ?Systolic - 106 269  ?Diastolic - 79 72  ?Pulse - 104 111  ? ? ?GENERAL: alert, oriented, appears well and in no acute distress ? ?HEENT: atraumatic, conjunttiva clear, no obvious abnormalities on inspection of external nose and ears ? ?NECK: normal movements of the head and neck ? ?LUNGS: on inspection no signs of respiratory distress, breathing rate appears normal, no obvious gross SOB, gasping or wheezing ? ?CV: no obvious cyanosis ? ?  MS: moves all visible extremities without noticeable abnormality ? ?PSYCH/NEURO: pleasant and cooperative, no obvious depression or anxiety, speech and thought processing grossly intact ? ?LABS: none today ? ?ASSESSMENT AND PLAN: ? ?Discussed the following assessment and plan: ? ?1) GAD. ?2) Panic disorder. ?3)  Palpitations. ? ?She has not responded to full dose fluoxetine. ?Discontinue this medication. ?Start Paxil 20 mg a day. ?Additionally, we will do trial of propranolol 20 mg up to 3 times a day as needed for severe anxiety flares. ?I have low suspicion that her palpitations are a manifestation of any cardiac or pulmonary pathology. ? ?I discussed the assessment and treatment plan with the patient. The patient was provided an opportunity to ask questions and all were answered. The patient agreed with the plan and demonstrated an understanding of the instructions. ? ?Signed:  Crissie Sickles, MD           02/27/2022 ? ?

## 2022-05-17 ENCOUNTER — Encounter: Payer: Self-pay | Admitting: Family Medicine

## 2022-05-17 MED ORDER — PROPRANOLOL HCL 20 MG PO TABS
ORAL_TABLET | ORAL | 1 refills | Status: DC
Start: 2022-05-17 — End: 2022-12-18

## 2022-05-17 MED ORDER — PAROXETINE HCL 20 MG PO TABS: 20.0000 mg | ORAL_TABLET | Freq: Every day | ORAL | 1 refills | Status: DC

## 2022-05-17 NOTE — Telephone Encounter (Signed)
Ok, rx's sent. F/u within the next 1-2 mo

## 2022-08-30 ENCOUNTER — Encounter: Payer: Self-pay | Admitting: Family Medicine

## 2022-08-30 DIAGNOSIS — F411 Generalized anxiety disorder: Secondary | ICD-10-CM

## 2022-08-30 NOTE — Progress Notes (Signed)
This encounter was created in error - please disregard.

## 2022-09-02 ENCOUNTER — Telehealth: Payer: Self-pay | Admitting: Family Medicine

## 2022-09-09 ENCOUNTER — Telehealth (INDEPENDENT_AMBULATORY_CARE_PROVIDER_SITE_OTHER): Payer: Self-pay | Admitting: Family Medicine

## 2022-09-09 ENCOUNTER — Encounter: Payer: Self-pay | Admitting: Family Medicine

## 2022-09-09 VITALS — Wt 120.0 lb

## 2022-09-09 DIAGNOSIS — F410 Panic disorder [episodic paroxysmal anxiety] without agoraphobia: Secondary | ICD-10-CM

## 2022-09-09 DIAGNOSIS — F411 Generalized anxiety disorder: Secondary | ICD-10-CM

## 2022-09-09 MED ORDER — PAROXETINE HCL 20 MG PO TABS: 20.0000 mg | ORAL_TABLET | Freq: Every day | ORAL | 1 refills | Status: DC

## 2022-09-09 NOTE — Progress Notes (Signed)
Virtual Visit via Video Note  I connected with Patricia Meyers on 09/09/22 at  1:00 PM EDT by a video enabled telemedicine application and verified that I am speaking with the correct person using two identifiers.  Location patient: [place] Location provider:work or home office Persons participating in the virtual visit: patient, provider  I discussed the limitations and requested verbal permission for telemedicine visit. The patient expressed understanding and agreed to proceed.   CC: 26 year old female being seen today for 33-month follow-up anxiety. A/P as of last visit: "1) GAD. 2) Panic disorder. 3) Palpitations.  She has not responded to full dose fluoxetine. Discontinue this medication. Start Paxil 20 mg a day. Additionally, we will do trial of propranolol 20 mg up to 3 times a day as needed for severe anxiety flares. I have low suspicion that her palpitations are a manifestation of any cardiac or pulmonary pathology."  INTERIM HX: Patricia Meyers says things are going well.  She says the paroxetine has helped significantly.  No panic attacks.  She is working full-time.  No side effects from paroxetine.  ROS: See pertinent positives and negatives per HPI.  Past Medical History:  Diagnosis Date   ADD (attention deficit disorder) 05/11/2013   with ODD/conduct disorder features per prior PCP notes.  No longer required meds after age 19.   GAD (generalized anxiety disorder)    Menorrhagia 01/26/2015   resolved as of age 82 or so.   Panic disorder    History reviewed. No pertinent surgical history.   Current Outpatient Medications:    PARoxetine (PAXIL) 20 MG tablet, Take 1 tablet (20 mg total) by mouth daily., Disp: 90 tablet, Rfl: 1   propranolol (INDERAL) 20 MG tablet, 1 tab po tid prn excessive anxiety (Patient not taking: Reported on 09/09/2022), Disp: 60 tablet, Rfl: 1  EXAM:  VITALS per patient if applicable:     2/68/3419    1:00 PM 01/07/2022   12:56 PM 03/23/2021    3:56 PM   Vitals with BMI  Weight 120 lbs 622 lbs   Systolic   297  Diastolic   79  Pulse   989    GENERAL: alert, oriented, appears well and in no acute distress  HEENT: atraumatic, conjunttiva clear, no obvious abnormalities on inspection of external nose and ears  NECK: normal movements of the head and neck  LUNGS: on inspection no signs of respiratory distress, breathing rate appears normal, no obvious gross SOB, gasping or wheezing  CV: no obvious cyanosis  MS: moves all visible extremities without noticeable abnormality  PSYCH/NEURO: pleasant and cooperative, no obvious depression or anxiety, speech and thought processing grossly intact  LABS: none today  ASSESSMENT AND PLAN:  Discussed the following assessment and plan:  GAD, history of panic attacks. Doing very well on paroxetine 20 mg a day.  Continue this. 90-day supply with 1 additional refill sent in today.   I discussed the assessment and treatment plan with the patient. The patient was provided an opportunity to ask questions and all were answered. The patient agreed with the plan and demonstrated an understanding of the instructions.   F/u: 6 months  Signed:  Crissie Sickles, MD           09/09/2022

## 2022-10-28 ENCOUNTER — Ambulatory Visit: Payer: Self-pay | Admitting: Adult Health

## 2022-11-25 ENCOUNTER — Encounter: Payer: Self-pay | Admitting: Family Medicine

## 2022-12-06 ENCOUNTER — Ambulatory Visit: Payer: Self-pay | Admitting: Adult Health

## 2022-12-18 ENCOUNTER — Ambulatory Visit (INDEPENDENT_AMBULATORY_CARE_PROVIDER_SITE_OTHER): Payer: Self-pay | Admitting: Family Medicine

## 2022-12-18 ENCOUNTER — Encounter: Payer: Self-pay | Admitting: Family Medicine

## 2022-12-18 VITALS — BP 111/77 | HR 94 | Temp 98.6°F | Ht 67.0 in | Wt 121.2 lb

## 2022-12-18 DIAGNOSIS — K581 Irritable bowel syndrome with constipation: Secondary | ICD-10-CM

## 2022-12-18 DIAGNOSIS — R140 Abdominal distension (gaseous): Secondary | ICD-10-CM

## 2022-12-18 DIAGNOSIS — K648 Other hemorrhoids: Secondary | ICD-10-CM

## 2022-12-18 DIAGNOSIS — K5900 Constipation, unspecified: Secondary | ICD-10-CM

## 2022-12-18 MED ORDER — LUBIPROSTONE 8 MCG PO CAPS: ORAL_CAPSULE | ORAL | 1 refills | Status: DC

## 2022-12-18 NOTE — Progress Notes (Signed)
OFFICE VISIT  12/18/2022  CC:  Chief Complaint  Patient presents with   Constipation     X2.5 months, has tried using prep H, miralax and fiber otc.     Patient is a 26 y.o. female who presents for constipation.  HPI: For the last 2 months or so she has had infrequent bowel movements, sometimes 7-10d between BMs.  Has not had a formed bowel movement during this time but describes an occasional bowel movement that is stringy and/or mushy.  No black stools.  She has occasional bright red blood per rectum and describes a prolapsing hemorrhoid that does not hurt.  She does endorse feeling bloated pretty frequently but denies any abdominal pain.  No diarrhea. She has occasionally used MiraLAX when it has been 7 to 10 days since a bowel movement and this does result in a more prominent amount of loose stool. No fever, no change in appetite or food intake/diet, no weight loss. No fatigue, no joint or muscle aches, no skin changes, no cold or heat intolerance.  Past Medical History:  Diagnosis Date   ADD (attention deficit disorder) 05/11/2013   with ODD/conduct disorder features per prior PCP notes.  No longer required meds after age 56.   GAD (generalized anxiety disorder)    Menorrhagia 01/26/2015   resolved as of age 55 or so.   Panic disorder     History reviewed. No pertinent surgical history.  Outpatient Medications Prior to Visit  Medication Sig Dispense Refill   PARoxetine (PAXIL) 20 MG tablet Take 1 tablet (20 mg total) by mouth daily. 90 tablet 1   buPROPion (WELLBUTRIN XL) 150 MG 24 hr tablet Take 1 tablet (150 mg total) by mouth daily. 30 tablet 1   propranolol (INDERAL) 20 MG tablet 1 tab po tid prn excessive anxiety (Patient not taking: Reported on 09/09/2022) 60 tablet 1   No facility-administered medications prior to visit.    No Known Allergies  Review of Systems  As per HPI  PE:    12/18/2022    2:23 PM 09/09/2022    1:00 PM 01/07/2022   12:56 PM  Vitals  with BMI  Height 5\' 7"     Weight 121 lbs 3 oz 120 lbs 112 lbs  BMI 18.98    Systolic 111    Diastolic 77    Pulse 94     Physical Exam  Gen: Alert, well appearing.  Patient is oriented to person, place, time, and situation. AFFECT: pleasant, lucid thought and speech. ABD: soft, NT, ND, BS normal.  No hepatospenomegaly or mass.  No bruits.   LABS:  None  IMPRESSION AND PLAN:  IBS constipation type. Trial of Amitiza 8 mcg twice daily. She describes a prolapsing internal hemorrhoid and we will hope to see this resolve as we get her bowels more regular. KUB ordered today.  An After Visit Summary was printed and given to the patient.  FOLLOW UP: No follow-ups on file.  Signed:  , MD           12/18/2022

## 2022-12-19 ENCOUNTER — Encounter: Payer: Self-pay | Admitting: Family Medicine

## 2023-01-24 ENCOUNTER — Encounter: Payer: Self-pay | Admitting: Family Medicine

## 2023-01-24 DIAGNOSIS — K5909 Other constipation: Secondary | ICD-10-CM

## 2023-01-24 NOTE — Telephone Encounter (Signed)
Abdominal x-ray order placed again, [facility]. No scheduling/appt is needed.

## 2023-03-10 ENCOUNTER — Encounter: Payer: Self-pay | Admitting: Family Medicine

## 2023-03-11 NOTE — Telephone Encounter (Signed)
I'm sure I have an opening in the coming week or so. Is the visit with me for a pap smear ? or discussion of your concerns (or both)?

## 2023-03-11 NOTE — Telephone Encounter (Signed)
Yes, okay for both 

## 2023-03-11 NOTE — Telephone Encounter (Signed)
Patient scheduled for 3/25 at [facility] with Dr. Anitra Lauth

## 2023-03-12 ENCOUNTER — Encounter (HOSPITAL_BASED_OUTPATIENT_CLINIC_OR_DEPARTMENT_OTHER): Payer: Self-pay | Admitting: Family Medicine

## 2023-03-12 ENCOUNTER — Ambulatory Visit (HOSPITAL_BASED_OUTPATIENT_CLINIC_OR_DEPARTMENT_OTHER)
Admission: RE | Admit: 2023-03-12 | Discharge: 2023-03-12 | Disposition: A | Discharge status: Home / Self Care | Payer: Self-pay | Source: Ambulatory Visit | Attending: Family Medicine | Admitting: Family Medicine

## 2023-03-12 DIAGNOSIS — K5909 Other constipation: Secondary | ICD-10-CM | POA: Insufficient documentation

## 2023-03-12 NOTE — Telephone Encounter (Signed)
noted 

## 2023-03-17 ENCOUNTER — Ambulatory Visit: Payer: Self-pay | Admitting: Family Medicine

## 2023-03-17 DIAGNOSIS — Z124 Encounter for screening for malignant neoplasm of cervix: Secondary | ICD-10-CM

## 2023-03-17 NOTE — Progress Notes (Deleted)
OFFICE VISIT  03/17/2023  CC: No chief complaint on file.   Patient is a 27 y.o. female who presents for f/u IBS w/constip, anxiety, and desires GYN exam/Pap smear.  INTERIM HX: ***  Past Medical History:  Diagnosis Date   ADD (attention deficit disorder) 05/11/2013   with ODD/conduct disorder features per prior PCP notes.  No longer required meds after age 80.   GAD (generalized anxiety disorder)    Medial meniscus tear    06/2021, right, bucket handle   Menorrhagia 01/26/2015   resolved as of age 39 or so.   Panic disorder     No past surgical history on file.  Outpatient Medications Prior to Visit  Medication Sig Dispense Refill   lubiprostone (AMITIZA) 8 MCG capsule 1 tab po bid 60 capsule 1   PARoxetine (PAXIL) 20 MG tablet Take 1 tablet (20 mg total) by mouth daily. 90 tablet 1   No facility-administered medications prior to visit.    No Known Allergies  Review of Systems As per HPI  PE:    12/18/2022    2:23 PM 09/09/2022    1:00 PM 01/07/2022   12:56 PM  Vitals with BMI  Height 5\' 7"     Weight 121 lbs 3 oz 120 lbs 112 lbs  BMI 99991111    Systolic 99991111    Diastolic 77    Pulse 94       Physical Exam  LABS:  NONE    IMPRESSION AND PLAN:  No problem-specific Assessment & Plan notes found for this encounter.   An After Visit Summary was printed and given to the patient.  FOLLOW UP: No follow-ups on file.  Signed:  Crissie Sickles, MD           03/17/2023

## 2023-04-02 ENCOUNTER — Other Ambulatory Visit: Payer: Self-pay | Admitting: Family Medicine

## 2023-04-15 ENCOUNTER — Ambulatory Visit: Payer: Self-pay | Admitting: Obstetrics & Gynecology

## 2023-07-01 ENCOUNTER — Other Ambulatory Visit: Payer: Self-pay

## 2023-07-01 ENCOUNTER — Encounter: Payer: Self-pay | Admitting: Family Medicine

## 2023-07-01 MED ORDER — PAROXETINE HCL 20 MG PO TABS: 20.0000 mg | ORAL_TABLET | Freq: Every day | ORAL | 0 refills | Status: DC

## 2023-08-27 ENCOUNTER — Ambulatory Visit (INDEPENDENT_AMBULATORY_CARE_PROVIDER_SITE_OTHER): Payer: Self-pay | Admitting: Family Medicine

## 2023-08-27 ENCOUNTER — Other Ambulatory Visit (HOSPITAL_COMMUNITY)
Admission: RE | Admit: 2023-08-27 | Discharge: 2023-08-27 | Disposition: A | Discharge status: Home / Self Care | Payer: Self-pay | Source: Ambulatory Visit | Attending: Family Medicine | Admitting: Family Medicine

## 2023-08-27 VITALS — BP 104/74 | HR 83 | Temp 98.8°F | Ht 67.0 in | Wt 120.0 lb

## 2023-08-27 DIAGNOSIS — Z124 Encounter for screening for malignant neoplasm of cervix: Secondary | ICD-10-CM

## 2023-08-27 DIAGNOSIS — Z0000 Encounter for general adult medical examination without abnormal findings: Secondary | ICD-10-CM

## 2023-08-27 DIAGNOSIS — F411 Generalized anxiety disorder: Secondary | ICD-10-CM

## 2023-08-27 LAB — CBC WITH DIFFERENTIAL/PLATELET
Basophils Absolute: 0 10*3/uL (ref 0.0–0.1)
Basophils Relative: 0.6 % (ref 0.0–3.0)
Eosinophils Absolute: 0.1 10*3/uL (ref 0.0–0.7)
Eosinophils Relative: 1.2 % (ref 0.0–5.0)
HCT: 39.9 % (ref 36.0–46.0)
Hemoglobin: 13 g/dL (ref 12.0–15.0)
Lymphocytes Relative: 27.4 % (ref 12.0–46.0)
Lymphs Abs: 1.4 10*3/uL (ref 0.7–4.0)
MCHC: 32.6 g/dL (ref 30.0–36.0)
MCV: 88.6 fl (ref 78.0–100.0)
Monocytes Absolute: 0.4 10*3/uL (ref 0.1–1.0)
Monocytes Relative: 8.6 % (ref 3.0–12.0)
Neutro Abs: 3.2 10*3/uL (ref 1.4–7.7)
Neutrophils Relative %: 62.2 % (ref 43.0–77.0)
Platelets: 290 10*3/uL (ref 150.0–400.0)
RBC: 4.5 Mil/uL (ref 3.87–5.11)
RDW: 13.2 % (ref 11.5–15.5)
WBC: 5.1 10*3/uL (ref 4.0–10.5)

## 2023-08-27 LAB — COMPREHENSIVE METABOLIC PANEL
ALT: 8 U/L (ref 0–35)
AST: 18 U/L (ref 0–37)
Albumin: 4.6 g/dL (ref 3.5–5.2)
Alkaline Phosphatase: 54 U/L (ref 39–117)
BUN: 9 mg/dL (ref 6–23)
CO2: 30 meq/L (ref 19–32)
Calcium: 10 mg/dL (ref 8.4–10.5)
Chloride: 101 meq/L (ref 96–112)
Creatinine, Ser: 0.69 mg/dL (ref 0.40–1.20)
GFR: 120.5 mL/min (ref >60.00)
Glucose, Bld: 83 mg/dL (ref 70–99)
Potassium: 4.9 meq/L (ref 3.5–5.1)
Sodium: 138 meq/L (ref 135–145)
Total Bilirubin: 0.5 mg/dL (ref 0.2–1.2)
Total Protein: 7.9 g/dL (ref 6.0–8.3)

## 2023-08-27 LAB — LIPID PANEL
Cholesterol: 206 mg/dL — ABNORMAL HIGH (ref 0–200)
HDL: 82.6 mg/dL (ref >39.00)
LDL Cholesterol: 108 mg/dL — ABNORMAL HIGH (ref 0–99)
NonHDL: 123.66
Total CHOL/HDL Ratio: 2
Triglycerides: 78 mg/dL (ref 0.0–149.0)
VLDL: 15.6 mg/dL (ref 0.0–40.0)

## 2023-08-27 LAB — TSH: TSH: 0.81 u[IU]/mL (ref 0.35–5.50)

## 2023-08-27 MED ORDER — BUPROPION HCL ER (XL) 150 MG PO TB24: 150.0000 mg | ORAL_TABLET | Freq: Every day | ORAL | 0 refills | Status: DC

## 2023-08-27 MED ORDER — PAROXETINE HCL 20 MG PO TABS: 20.0000 mg | ORAL_TABLET | Freq: Every day | ORAL | 1 refills | Status: DC

## 2023-08-27 NOTE — Patient Instructions (Signed)

## 2023-08-27 NOTE — Progress Notes (Signed)
Office Note 08/27/2023  CC:  Chief Complaint  Patient presents with   Annual Exam   HPI:  Patient is a 27 y.o. female who is here for annual health maintenance exam and follow-up chronic anxiety.  She is doing well on Paxil as far as efficacy but states that ever since she got on it she has had no sex drive. She has never had a pelvic exam or Pap smear before. LMP was about 2 weeks ago. Periods are regularly occurring, normal amount of bleeding.   Past Medical History:  Diagnosis Date   ADD (attention deficit disorder) 05/11/2013   with ODD/conduct disorder features per prior PCP notes.  No longer required meds after age 46.   GAD (generalized anxiety disorder)    Medial meniscus tear    06/2021, right, bucket handle   Menorrhagia 01/26/2015   resolved as of age 42 or so.   Panic disorder     No past surgical history on file.  Family History  Problem Relation Age of Onset   Hypertension Mother    Cancer Maternal Grandmother    Lung cancer Maternal Grandmother    Cancer Maternal Grandfather    Heart disease Maternal Grandfather    Lung cancer Maternal Grandfather    Alcohol abuse Maternal Grandfather    Arthritis Paternal Grandfather    Hearing loss Paternal Grandfather    Asthma Paternal Grandfather     Social History   Socioeconomic History   Marital status: Single    Spouse name: Not on file   Number of children: Not on file   Years of education: Not on file   Highest education level: Not on file  Occupational History   Not on file  Tobacco Use   Smoking status: Never   Smokeless tobacco: Never  Vaping Use   Vaping status: Never Used  Substance and Sexual Activity   Alcohol use: No   Drug use: No   Sexual activity: Yes  Other Topics Concern   Not on file  Social History Narrative   Single, living with a friend as of 05/2018.   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   No tobacco.   Alcohol: none.   No drugs.   She is homosexual,  identifies herself as female.   Social Determinants of Health   Financial Resource Strain: Not on file  Food Insecurity: Not on file  Transportation Needs: Not on file  Physical Activity: Not on file  Stress: Not on file  Social Connections: Not on file  Intimate Partner Violence: Not on file    Outpatient Medications Prior to Visit  Medication Sig Dispense Refill   lubiprostone (AMITIZA) 8 MCG capsule 1 tab po bid 60 capsule 1   PARoxetine (PAXIL) 20 MG tablet Take 1 tablet (20 mg total) by mouth daily. OFFICE VISIT NEEDED FOR FURTHER REFILLS 30 tablet 0   No facility-administered medications prior to visit.    No Known Allergies  Review of Systems  Constitutional:  Negative for appetite change, chills, fatigue and fever.  HENT:  Negative for congestion, dental problem, ear pain and sore throat.   Eyes:  Negative for discharge, redness and visual disturbance.  Respiratory:  Negative for cough, chest tightness, shortness of breath and wheezing.   Cardiovascular:  Negative for chest pain, palpitations and leg swelling.  Gastrointestinal:  Negative for abdominal pain, blood in stool, diarrhea, nausea and vomiting.  Genitourinary:  Negative for difficulty urinating, dysuria, flank pain, frequency, hematuria and  urgency.  Musculoskeletal:  Negative for arthralgias, back pain, joint swelling, myalgias and neck stiffness.  Skin:  Negative for pallor and rash.  Neurological:  Negative for dizziness, speech difficulty, weakness and headaches.  Hematological:  Negative for adenopathy. Does not bruise/bleed easily.  Psychiatric/Behavioral:  Negative for confusion and sleep disturbance. The patient is not nervous/anxious.     PE;    08/27/2023    1:31 PM 12/18/2022    2:23 PM 09/09/2022    1:00 PM  Vitals with BMI  Height 5\' 7"  5\' 7"    Weight 120 lbs 121 lbs 3 oz 120 lbs  BMI 18.79 18.98   Systolic 104 111   Diastolic 74 77   Pulse 83 94     Exam chaperoned by Emi Holes,  CMA. Gen: Alert, well appearing.  Patient is oriented to person, place, time, and situation. AFFECT: pleasant, lucid thought and speech. ENT: Ears: EACs clear, normal epithelium.  TMs with good light reflex and landmarks bilaterally.  Eyes: no injection, icteris, swelling, or exudate.  EOMI, PERRLA. Nose: no drainage or turbinate edema/swelling.  No injection or focal lesion.  Mouth: lips without lesion/swelling.  Oral mucosa pink and moist.  Dentition intact and without obvious caries or gingival swelling.  Oropharynx without erythema, exudate, or swelling.  Neck: supple/nontender.  No LAD, mass, or TM.  Carotid pulses 2+ bilaterally, without bruits. CV: RRR, no m/r/g.   LUNGS: CTA bilat, nonlabored resps, good aeration in all lung fields. ABD: soft, NT, ND, BS normal.  No hepatospenomegaly or mass.  No bruits. EXT: no clubbing, cyanosis, or edema.  Musculoskeletal: no joint swelling, erythema, warmth, or tenderness.  ROM of all joints intact. Skin - no sores or suspicious lesions or rashes or color changes Vagina and vulva are normal;  clear discharge is noted.  Cervix normal without lesions. Uterus anteverted and mobile, normal in size and shape without tenderness.  Adnexa normal in size without masses or tenderness. Pap Smear - is completed today. Exam chaperoned by female assistant.  Pertinent labs:  None  ASSESSMENT AND PLAN:   #1 health maintenance exam with pelvic exam/pap smear: Reviewed age and gender appropriate health maintenance issues (prudent diet, regular exercise, health risks of tobacco and excessive alcohol, use of seatbelts, fire alarms in home, use of sunscreen).  Also reviewed age and gender appropriate health screening as well as vaccine recommendations. Vaccines: she declined Tdap and flu. Labs: Fasting health panel Cervical ca screening: Pelvic normal today, Pap smear was done today.  #2 GAD. Continue Paxil 20 mg a day. Add Wellbutrin XL 150 mg today to see if this  helps with Paxil's side effect of decreased libido.  An After Visit Summary was printed and given to the patient.  FOLLOW UP:  Return in about 4 weeks (around 09/24/2023) for f/u anxiety/med side effect.  Signed:  Santiago Bumpers, MD           08/27/2023

## 2023-08-28 ENCOUNTER — Encounter: Payer: Self-pay | Admitting: Family Medicine

## 2023-08-28 LAB — CYTOLOGY - PAP
Chlamydia: NEGATIVE
Comment: NEGATIVE
Comment: NEGATIVE
Comment: NORMAL
Diagnosis: NEGATIVE
Neisseria Gonorrhea: NEGATIVE
Trichomonas: NEGATIVE

## 2023-10-09 ENCOUNTER — Encounter: Payer: Self-pay | Admitting: Family Medicine

## 2023-10-09 NOTE — Telephone Encounter (Signed)
Take 1 capful of miralax every 3 hours until you have a large bowel movement.  Needs to f/u with me in office. We can discuss how to better manage this long-term and we can follow up how she's doing on wellbutrin that I started her on last visit on 08/27/23.

## 2023-10-27 ENCOUNTER — Encounter: Payer: Self-pay | Admitting: Family Medicine

## 2023-10-27 ENCOUNTER — Ambulatory Visit: Payer: Self-pay | Admitting: Family Medicine

## 2023-10-27 NOTE — Progress Notes (Deleted)
OFFICE VISIT  10/27/2023  CC: No chief complaint on file.   Patient is a 27 y.o. female who presents for "bowel issues"  HPI: ***   KUB on 03/12/2023 showed moderate fecal loading throughout the length of the colon.  Past Medical History:  Diagnosis Date   ADD (attention deficit disorder) 05/11/2013   with ODD/conduct disorder features per prior PCP notes.  No longer required meds after age 51.   GAD (generalized anxiety disorder)    Medial meniscus tear    06/2021, right, bucket handle   Menorrhagia 01/26/2015   resolved as of age 74 or so.   Panic disorder     No past surgical history on file.  Outpatient Medications Prior to Visit  Medication Sig Dispense Refill   buPROPion (WELLBUTRIN XL) 150 MG 24 hr tablet Take 1 tablet (150 mg total) by mouth daily. 30 tablet 0   lubiprostone (AMITIZA) 8 MCG capsule 1 tab po bid 60 capsule 1   PARoxetine (PAXIL) 20 MG tablet Take 1 tablet (20 mg total) by mouth daily. 90 tablet 1   No facility-administered medications prior to visit.    No Known Allergies  Review of Systems  As per HPI  PE:    08/27/2023    1:31 PM 12/18/2022    2:23 PM 09/09/2022    1:00 PM  Vitals with BMI  Height 5\' 7"  5\' 7"    Weight 120 lbs 121 lbs 3 oz 120 lbs  BMI 18.79 18.98   Systolic 104 111   Diastolic 74 77   Pulse 83 94      Physical Exam  ***  LABS:  Last CBC Lab Results  Component Value Date   WBC 5.1 08/27/2023   HGB 13.0 08/27/2023   HCT 39.9 08/27/2023   MCV 88.6 08/27/2023   RDW 13.2 08/27/2023   PLT 290.0 08/27/2023   Last metabolic panel Lab Results  Component Value Date   GLUCOSE 83 08/27/2023   NA 138 08/27/2023   K 4.9 08/27/2023   CL 101 08/27/2023   CO2 30 08/27/2023   BUN 9 08/27/2023   CREATININE 0.69 08/27/2023   GFR 120.50 08/27/2023   CALCIUM 10.0 08/27/2023   PROT 7.9 08/27/2023   ALBUMIN 4.6 08/27/2023   BILITOT 0.5 08/27/2023   ALKPHOS 54 08/27/2023   AST 18 08/27/2023   ALT 8 08/27/2023    Last lipids Lab Results  Component Value Date   CHOL 206 (H) 08/27/2023   HDL 82.60 08/27/2023   LDLCALC 108 (H) 08/27/2023   TRIG 78.0 08/27/2023   CHOLHDL 2 08/27/2023   Last thyroid functions Lab Results  Component Value Date   TSH 0.81 08/27/2023   IMPRESSION AND PLAN:  No problem-specific Assessment & Plan notes found for this encounter.   An After Visit Summary was printed and given to the patient.  FOLLOW UP: No follow-ups on file.  Signed:  Santiago Bumpers, MD           10/27/2023

## 2023-11-25 ENCOUNTER — Encounter: Payer: Self-pay | Admitting: Family Medicine

## 2024-03-25 ENCOUNTER — Other Ambulatory Visit: Payer: Self-pay | Admitting: Family Medicine

## 2024-04-23 ENCOUNTER — Ambulatory Visit: Admitting: Family Medicine

## 2024-04-23 ENCOUNTER — Encounter: Payer: Self-pay | Admitting: Family Medicine

## 2024-04-23 VITALS — BP 120/81 | HR 78 | Ht 67.0 in | Wt 120.0 lb

## 2024-04-23 DIAGNOSIS — F422 Mixed obsessional thoughts and acts: Secondary | ICD-10-CM | POA: Diagnosis not present

## 2024-04-23 DIAGNOSIS — F411 Generalized anxiety disorder: Secondary | ICD-10-CM | POA: Diagnosis not present

## 2024-04-23 DIAGNOSIS — N760 Acute vaginitis: Secondary | ICD-10-CM

## 2024-04-23 DIAGNOSIS — N898 Other specified noninflammatory disorders of vagina: Secondary | ICD-10-CM

## 2024-04-23 DIAGNOSIS — B9689 Other specified bacterial agents as the cause of diseases classified elsewhere: Secondary | ICD-10-CM

## 2024-04-23 MED ORDER — METRONIDAZOLE 500 MG PO TABS: 500.0000 mg | ORAL_TABLET | Freq: Two times a day (BID) | ORAL | 0 refills | Status: AC

## 2024-04-23 MED ORDER — FLUVOXAMINE MALEATE 100 MG PO TABS: ORAL_TABLET | ORAL | 0 refills | Status: DC

## 2024-04-23 NOTE — Progress Notes (Signed)
 OFFICE VISIT  04/23/2024  CC:  Chief Complaint  Patient presents with   Medical Management of Chronic Issues    Pt would like to discuss medications, she also has a urinary concern; foul odor and more discharge than normal, denies itching    Patient is a 28 y.o. female who presents for recheck anxiety and discuss vaginal symptoms. A/P as of last visit 08/27/23: " GAD. Continue Paxil  20 mg a day. Add Wellbutrin  XL 150 mg today to see if this helps with Paxil 's side effect of decreased libido."  INTERIM HX: The Wellbutrin  did not help her.  She has continued to take the Paxil . She is very frustrated by OCD symptoms.  She feels like the symptoms are actually what triggers worsening anxiety and worry for her. She states that she has intrusive obsessive thoughts which trigger lots of anxiety and make her do things to relieve the anxiety.  Things such as checking locked doors, turning lights on and off, going back and forth to the toilet at night to make sure she has emptied her bladder and (although she knows it has emptied). If she does not carry out these behaviors she gets the thought that something terrible is going to happen to someone.  She realizes her thoughts and behaviors are irrational/unrealistic. She denies depressed mood.  Life otherwise is going good for her.  She has moved to [place] with her partner.  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  Lately she has had a significant vaginal discharge that she describes as milky and white and malodorous.  Says her partner frequently has bacterial vaginosis. No bloody discharge, no itching. No dysuria.  Past Medical History:  Diagnosis Date   ADD (attention deficit disorder) 05/11/2013   with ODD/conduct disorder features per prior PCP notes.  No longer required meds after age 81.   Chronic constipation    GAD (generalized anxiety disorder)    Medial meniscus tear    06/2021, right, bucket handle    Menorrhagia 01/26/2015   resolved as of age 76 or so.   Panic disorder     History reviewed. No pertinent surgical history.  Outpatient Medications Prior to Visit  Medication Sig Dispense Refill   PARoxetine  (PAXIL ) 20 MG tablet TAKE ONE TABLET BY MOUTH EVERY DAY 30 tablet 0   buPROPion  (WELLBUTRIN  XL) 150 MG 24 hr tablet Take 1 tablet (150 mg total) by mouth daily. (Patient not taking: Reported on 04/23/2024) 30 tablet 0   lubiprostone  (AMITIZA ) 8 MCG capsule 1 tab po bid (Patient not taking: Reported on 04/23/2024) 60 capsule 1   No facility-administered medications prior to visit.    No Known Allergies  Review of Systems As per HPI  PE:    04/23/2024    2:22 PM 08/27/2023    1:31 PM 12/18/2022    2:23 PM  Vitals with BMI  Height 5\' 7"  5\' 7"  5\' 7"   Weight 120 lbs 120 lbs 121 lbs 3 oz  BMI 18.79 18.79 18.98  Systolic 120 104 782  Diastolic 81 74 77  Pulse 78 83 94     Physical Exam  Gen: Alert, well appearing.  Patient is oriented to person, place, time, and situation.` AFFECT: pleasant, lucid thought and speech. No further exam today  LABS:  Last CBC Lab Results  Component Value Date   WBC 5.1 08/27/2023   HGB 13.0 08/27/2023   HCT 39.9 08/27/2023   MCV 88.6 08/27/2023   RDW  13.2 08/27/2023   PLT 290.0 08/27/2023   Last metabolic panel Lab Results  Component Value Date   GLUCOSE 83 08/27/2023   NA 138 08/27/2023   K 4.9 08/27/2023   CL 101 08/27/2023   CO2 30 08/27/2023   BUN 9 08/27/2023   CREATININE 0.69 08/27/2023   GFR 120.50 08/27/2023   CALCIUM 10.0 08/27/2023   PROT 7.9 08/27/2023   ALBUMIN 4.6 08/27/2023   BILITOT 0.5 08/27/2023   ALKPHOS 54 08/27/2023   AST 18 08/27/2023   ALT 8 08/27/2023   Last lipids Lab Results  Component Value Date   CHOL 206 (H) 08/27/2023   HDL 82.60 08/27/2023   LDLCALC 108 (H) 08/27/2023   TRIG 78.0 08/27/2023   CHOLHDL 2 08/27/2023   Last thyroid functions Lab Results  Component Value Date   TSH 0.81  08/27/2023   IMPRESSION AND PLAN:  #1 obsessive-compulsive disorder. Stop Paxil . Start Luvox, one half of the 100 mg tab daily for 10 days and then increase to a whole tab daily. Therapeutic expectations and side effect profile of medication discussed today.  Patient's questions answered. She is open to counseling/therapist referral today.  #2 bacterial vaginosis. Metronidazole 500 mg twice daily x 7 days prescribed today. Patient did self swab of her vaginal discharge today and we sent this to the lab.  An After Visit Summary was printed and given to the patient.  FOLLOW UP: Return in about 4 weeks (around 05/21/2024) for Follow-up OCD. Next CPE September 2025 Signed:  Arletha Lady, MD           04/23/2024

## 2024-04-26 LAB — NUSWAB BV AND CANDIDA, NAA
BVAB 2: HIGH {score} — AB
Candida albicans, NAA: NEGATIVE
Candida glabrata, NAA: NEGATIVE
Megasphaera 1: HIGH {score} — AB

## 2024-04-27 ENCOUNTER — Encounter: Payer: Self-pay | Admitting: Family Medicine

## 2024-05-03 ENCOUNTER — Encounter: Payer: Self-pay | Admitting: Family Medicine

## 2024-05-03 MED ORDER — PAROXETINE HCL 20 MG PO TABS: 20.0000 mg | ORAL_TABLET | Freq: Every day | ORAL | 1 refills | Status: DC

## 2024-05-03 NOTE — Telephone Encounter (Signed)
 Message was previously forwarded to PCP, waiting for response.

## 2024-05-03 NOTE — Telephone Encounter (Signed)
 Okay.  Paxil  prescription sent. Needs to follow-up with me later this week or early next week if not starting to improve.

## 2024-06-07 ENCOUNTER — Ambulatory Visit: Admitting: Family Medicine

## 2024-11-08 ENCOUNTER — Other Ambulatory Visit: Payer: Self-pay | Admitting: Family Medicine

## 2024-11-08 NOTE — Telephone Encounter (Signed)
 Last OV 04/2024, provider advised pt to stop Paxil 

## 2024-11-10 ENCOUNTER — Telehealth: Admitting: Family Medicine

## 2024-11-10 DIAGNOSIS — F411 Generalized anxiety disorder: Secondary | ICD-10-CM

## 2024-11-10 DIAGNOSIS — F422 Mixed obsessional thoughts and acts: Secondary | ICD-10-CM | POA: Diagnosis not present

## 2024-11-10 MED ORDER — PAROXETINE HCL 30 MG PO TABS
30.0000 mg | ORAL_TABLET | Freq: Every day | ORAL | 3 refills | Status: AC
Start: 2024-11-10 — End: ?

## 2024-11-10 NOTE — Progress Notes (Signed)
 Virtual Visit via Video Note  I connected with Patricia Meyers   on 11/10/24 at  9:40 AM EST by a video enabled telemedicine application and verified that I am speaking with the correct person using two identifiers.  Location patient: [place] Location provider:work or home office Persons participating in the virtual visit: patient, provider  I discussed the limitations and requested verbal permission for telemedicine visit. The patient expressed understanding and agreed to proceed.  CC:   31-month follow-up anxiety/OCD. A/P as of last visit: #1 obsessive-compulsive disorder. Stop Paxil . Start Luvox , one half of the 100 mg tab daily for 10 days and then increase to a whole tab daily. Therapeutic expectations and side effect profile of medication discussed today.  Patient's questions answered. She is open to counseling/therapist referral today.   #2 bacterial vaginosis. Metronidazole  500 mg twice daily x 7 days prescribed today. Patient did self swab of her vaginal discharge today and we sent this to the lab.  INTERIM HX: 28 year old female being seen today for follow-up anxiety/OCD. The trial of Luvox  was brief because it made her sick and more anxious. We switched her back to her Paxil  20 mg a day. [facility] reached out to her to schedule but they were unable to reach her.  She feels pretty good. Still has some chronic worry but no panic.  She still has some obsessions/compulsions as outlined in prior notes. No depression. She is interested in increasing her medication dose.    ROS: See pertinent positives and negatives per HPI.  Past Medical History:  Diagnosis Date   ADD (attention deficit disorder) 05/11/2013   with ODD/conduct disorder features per prior PCP notes.  No longer required meds after age 89.   Chronic constipation    GAD (generalized anxiety disorder)    Medial meniscus tear    06/2021, right, bucket handle   Menorrhagia 01/26/2015   resolved as of  age 56 or so.   Panic disorder     History reviewed. No pertinent surgical history.   Current Outpatient Medications:    PARoxetine  (PAXIL ) 30 MG tablet, Take 1 tablet (30 mg total) by mouth daily., Disp: 30 tablet, Rfl: 3  EXAM:  VITALS per patient if applicable:     04/23/2024    2:22 PM 08/27/2023    1:31 PM 12/18/2022    2:23 PM  Vitals with BMI  Height 5' 7 5' 7 5' 7  Weight 120 lbs 120 lbs 121 lbs 3 oz  BMI 18.79 18.79 18.98  Systolic 120 104 888  Diastolic 81 74 77  Pulse 78 83 94     GENERAL: alert, oriented, appears well and in no acute distress  HEENT: atraumatic, conjunttiva clear, no obvious abnormalities on inspection of external nose and ears  NECK: normal movements of the head and neck  LUNGS: on inspection no signs of respiratory distress, breathing rate appears normal, no obvious gross SOB, gasping or wheezing  CV: no obvious cyanosis  MS: moves all visible extremities without noticeable abnormality  PSYCH/NEURO: pleasant and cooperative, no obvious depression or anxiety, speech and thought processing grossly intact  LABS: none today    Chemistry      Component Value Date/Time   NA 138 08/27/2023 1353   K 4.9 08/27/2023 1353   CL 101 08/27/2023 1353   CO2 30 08/27/2023 1353   BUN 9 08/27/2023 1353   CREATININE 0.69 08/27/2023 1353      Component Value Date/Time   CALCIUM 10.0 08/27/2023 1353  ALKPHOS 54 08/27/2023 1353   AST 18 08/27/2023 1353   ALT 8 08/27/2023 1353   BILITOT 0.5 08/27/2023 1353      ASSESSMENT AND PLAN:  Discussed the following assessment and plan:  Generalized anxiety disorder, OCD. A lot of her panic-type anxiety and her depressed mood have improved significantly on Paxil  20 mg a day. She has some room for improvement in generalized anxiety and obsessive-compulsive symptoms. Increase Paxil  to 30 mg a day.    I discussed the assessment and treatment plan with the patient. The patient was provided an  opportunity to ask questions and all were answered. The patient agreed with the plan and demonstrated an understanding of the instructions.   F/u: 2 mo  Signed:  Gerlene Hockey, MD           11/10/2024
